# Patient Record
Sex: Male | Born: 1985
Health system: Southern US, Community
[De-identification: ages and names within clinical notes are randomized; demographics above are authoritative.]

---

## 2014-03-23 ENCOUNTER — Emergency Department (HOSPITAL_COMMUNITY): Payer: Self-pay

## 2014-03-23 ENCOUNTER — Encounter (HOSPITAL_COMMUNITY): Payer: Self-pay | Admitting: Emergency Medicine

## 2014-03-23 ENCOUNTER — Emergency Department (HOSPITAL_COMMUNITY)
Admission: EM | Admit: 2014-03-23 | Discharge: 2014-03-23 | Disposition: A | Discharge status: Home / Self Care | Payer: Self-pay | Attending: Emergency Medicine | Admitting: Emergency Medicine

## 2014-03-23 DIAGNOSIS — M20022 Boutonniere deformity of left finger(s): Secondary | ICD-10-CM

## 2014-03-23 DIAGNOSIS — Y9361 Activity, american tackle football: Secondary | ICD-10-CM | POA: Insufficient documentation

## 2014-03-23 DIAGNOSIS — Y9239 Other specified sports and athletic area as the place of occurrence of the external cause: Secondary | ICD-10-CM | POA: Insufficient documentation

## 2014-03-23 DIAGNOSIS — F17200 Nicotine dependence, unspecified, uncomplicated: Secondary | ICD-10-CM | POA: Insufficient documentation

## 2014-03-23 DIAGNOSIS — M20029 Boutonniere deformity of unspecified finger(s): Secondary | ICD-10-CM | POA: Insufficient documentation

## 2014-03-23 DIAGNOSIS — Y92838 Other recreation area as the place of occurrence of the external cause: Secondary | ICD-10-CM

## 2014-03-23 DIAGNOSIS — X500XXA Overexertion from strenuous movement or load, initial encounter: Secondary | ICD-10-CM | POA: Insufficient documentation

## 2014-03-23 NOTE — Discharge Instructions (Signed)
Read the information below.  You may return to the Emergency Department at any time for worsening condition or any new symptoms that concern you.  If you develop uncontrolled pain, weakness or numbness of the extremity, severe discoloration of the skin, or you are unable to move your finger, return to the ER for a recheck.    °

## 2014-03-23 NOTE — ED Provider Notes (Signed)
CSN: [id number]     Arrival date & time 03/23/14  1649 History   First MD Initiated Contact with Patient 03/23/14 1727     Chief Complaint  Patient presents with  . Finger Injury     (Consider location/radiation/quality/duration/timing/severity/associated sxs/prior Treatment) The history is provided by the patient.    Pt reports he was blocking another player while playing football last month and his left 4th and 5th fingers bent backwards.  States the trainer taped his fingers for 1-2 weeks and they improved somewhat, but he has continued to have mild swelling and pain in the fingers, around the PIP joints.  He is unable to fully straighten the 5th finger at the PIP joint.  Has pain over the radial aspect of the 4th digit PIP.  Pain is only occasional, especially when his hand gets caught on something.  Denies weakness or numbness of the fingers.  Denies new injury.  Denies other injury.    History reviewed. No pertinent past medical history. History reviewed. No pertinent past surgical history. No family history on file. History  Substance Use Topics  . Smoking status: Current Every Day Smoker    Types: Cigars  . Smokeless tobacco: Never Used  . Alcohol Use: Yes     Comment: Socially     Review of Systems  Constitutional: Negative for fever.  Musculoskeletal: Positive for arthralgias and joint swelling.  Skin: Negative for color change, rash and wound.  Allergic/Immunologic: Negative for immunocompromised state.  Neurological: Negative for weakness and numbness.  Hematological: Does not bruise/bleed easily.      Allergies  Review of patient's allergies indicates no known allergies.  Home Medications   Prior to Admission medications   Not on File   BP 136/84  Pulse 67  Temp(Src) 98.4 F (36.9 C) (Oral)  Resp 20  SpO2 100% Physical Exam  Nursing note and vitals reviewed. Constitutional: He appears well-developed and well-nourished. No distress.  HENT:  Head:  Normocephalic and atraumatic.  Neck: Neck supple.  Pulmonary/Chest: Effort normal.  Musculoskeletal:       Left wrist: Normal.       Hands: Pt able to fully flex all fingers, strength 5/5 with flexion and extension.  Unable to fully extend 5th digit.  4th and 5th digits on left hand slightly larger than same digits of right hand.  Capillary refill < 2 seconds, sensation intact.    Neurological: He is alert.  Skin: He is not diaphoretic.    ED Course  Procedures (including critical care time) Labs Review Labs Reviewed - No data to display  Imaging Review Dg Hand Complete Left  03/23/2014   CLINICAL DATA:  Persistent pain and inability to straighten the little finger since trauma to the fourth and fifth digits 1 month ago.  EXAM: LEFT HAND - COMPLETE 3+ VIEW  COMPARISON:  None.  FINDINGS: There is a boutonniere deformity of the little finger. There is slight irregularity of the volar aspect of the base of the distal phalanx but there is no osseous avulsion.  There is a small ossification at the volar aspect of the PIP joint of the ring finger which could represent a tiny old avulsion but there is no visible defect at the base of the middle phalanx.  IMPRESSION: No acute osseous abnormalities. Boutonniere deformity of the little finger.   Electronically Signed   By: Geanie CooleyJim  Maxwell M.D.   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 Neurovascularly intact.  Xray shows boutonniere deformity.  Placed in finger splint, hand surgery follow up. Pt given information regarding his diagnosis. Discussed result, findings, treatment, and follow up  with patient.  Pt given return precautions.  Pt verbalizes understanding and agrees with plan.         Trixie Dredgemily Katielynn Horan, PA-C 03/23/14 1934

## 2014-03-23 NOTE — ED Notes (Addendum)
Pt reports jamming his fingers (left pinky and ring finger) one month ago while playing football. Pt reports that he has had this happen in the past, however the swelling has lasted longer than it normally does. Pt is A/O x4, in NAD, and vitals are WDL. Capillary refill in the affected fingers is less than 2 seconds and pt is able to touch the palm of his hand with all fingers.

## 2014-03-30 NOTE — ED Provider Notes (Signed)
Medical screening examination/treatment/procedure(s) were performed by non-physician practitioner and as supervising physician I was immediately available for consultation/collaboration.    Jonny Longino L Taleigh Gero, MD 03/30/14 0725 

## 2015-12-10 DIAGNOSIS — L219 Seborrheic dermatitis, unspecified: Secondary | ICD-10-CM | POA: Diagnosis not present

## 2016-02-19 DIAGNOSIS — L03317 Cellulitis of buttock: Secondary | ICD-10-CM | POA: Diagnosis not present

## 2016-02-19 DIAGNOSIS — L723 Sebaceous cyst: Secondary | ICD-10-CM | POA: Diagnosis not present

## 2016-02-27 DIAGNOSIS — L701 Acne conglobata: Secondary | ICD-10-CM | POA: Diagnosis not present

## 2016-12-04 DIAGNOSIS — M9904 Segmental and somatic dysfunction of sacral region: Secondary | ICD-10-CM | POA: Diagnosis not present

## 2016-12-04 DIAGNOSIS — M9902 Segmental and somatic dysfunction of thoracic region: Secondary | ICD-10-CM | POA: Diagnosis not present

## 2016-12-04 DIAGNOSIS — M62830 Muscle spasm of back: Secondary | ICD-10-CM | POA: Diagnosis not present

## 2016-12-04 DIAGNOSIS — S336XXA Sprain of sacroiliac joint, initial encounter: Secondary | ICD-10-CM | POA: Diagnosis not present

## 2016-12-04 DIAGNOSIS — M5386 Other specified dorsopathies, lumbar region: Secondary | ICD-10-CM | POA: Diagnosis not present

## 2016-12-04 DIAGNOSIS — M9903 Segmental and somatic dysfunction of lumbar region: Secondary | ICD-10-CM | POA: Diagnosis not present

## 2017-03-02 MED FILL — HYDROCODON-APAP 5-325: 5-325 | 4 days supply | Qty: 20 | Fill #0

## 2017-03-02 MED FILL — PENICILLIN VK 500 MG TABLET: 500 | 7 days supply | Qty: 28 | Fill #0

## 2017-07-20 ENCOUNTER — Emergency Department (HOSPITAL_COMMUNITY)
Admission: EM | Admit: 2017-07-20 | Discharge: 2017-07-20 | Disposition: A | Discharge status: Home / Self Care | Payer: 59 | Attending: Emergency Medicine | Admitting: Emergency Medicine

## 2017-07-20 ENCOUNTER — Emergency Department (HOSPITAL_COMMUNITY): Payer: 59

## 2017-07-20 ENCOUNTER — Encounter (HOSPITAL_COMMUNITY): Payer: Self-pay | Admitting: Emergency Medicine

## 2017-07-20 DIAGNOSIS — Z87891 Personal history of nicotine dependence: Secondary | ICD-10-CM | POA: Insufficient documentation

## 2017-07-20 DIAGNOSIS — R319 Hematuria, unspecified: Secondary | ICD-10-CM | POA: Diagnosis not present

## 2017-07-20 DIAGNOSIS — R310 Gross hematuria: Secondary | ICD-10-CM | POA: Insufficient documentation

## 2017-07-20 LAB — URINALYSIS, ROUTINE W REFLEX MICROSCOPIC
BILIRUBIN URINE: NEGATIVE
Glucose, UA: NEGATIVE mg/dL
KETONES UR: NEGATIVE mg/dL
LEUKOCYTES UA: NEGATIVE
NITRITE: NEGATIVE
PH: 6 (ref 5.0–8.0)
PROTEIN: NEGATIVE mg/dL
Specific Gravity, Urine: 1.016 (ref 1.005–1.030)
Squamous Epithelial / LPF: NONE SEEN

## 2017-07-20 LAB — CBC
HCT: 42.1 % (ref 39.0–52.0)
Hemoglobin: 14.1 g/dL (ref 13.0–17.0)
MCH: 30.3 pg (ref 26.0–34.0)
MCHC: 33.5 g/dL (ref 30.0–36.0)
MCV: 90.3 fL (ref 78.0–100.0)
PLATELETS: 204 10*3/uL (ref 150–400)
RBC: 4.66 MIL/uL (ref 4.22–5.81)
RDW: 12.8 % (ref 11.5–15.5)
WBC: 5.1 10*3/uL (ref 4.0–10.5)

## 2017-07-20 LAB — CK: CK TOTAL: 359 U/L (ref 49–397)

## 2017-07-20 LAB — BASIC METABOLIC PANEL
Anion gap: 6 (ref 5–15)
BUN: 15 mg/dL (ref 6–20)
CALCIUM: 9.5 mg/dL (ref 8.9–10.3)
CO2: 26 mmol/L (ref 22–32)
CREATININE: 1.26 mg/dL — AB (ref 0.61–1.24)
Chloride: 105 mmol/L (ref 101–111)
Glucose, Bld: 87 mg/dL (ref 65–99)
Potassium: 4.5 mmol/L (ref 3.5–5.1)
SODIUM: 137 mmol/L (ref 135–145)

## 2017-07-20 NOTE — ED Notes (Signed)
Pt to CT

## 2017-07-20 NOTE — Discharge Instructions (Signed)
Drink plenty of fluids and get plenty of rest.  Return to the emergency department if you develop high fever, severe pain, or other new and concerning symptoms.

## 2017-07-20 NOTE — ED Provider Notes (Signed)
[facility]EMER[facility]ENCY DEPT Provider Note   CSN: [id number] Arrival date & time: 07/20/17  1634     History   Chief Complaint Chief Complaint  Patient presents with  . Hematuria    HPI Wesley Aguilar is a 31 y.o. male.  Patient is a 31 year old male with no significant past medical history presenting with complaints of hematuria. He states that this morning he woke up and went to the bathroom. When he urinated, he noticed that it was dark in color. His urine stream then stopped, then began again when he noticed a small clot. He denies any fevers or chills. He denies any dysuria. His significant other was concerned about rhabdomyolysis and told him to come here to be evaluated. He denies any flank pain.   The history is provided by the patient.  Hematuria  This is a new problem. Episode onset: this morning. The problem occurs constantly. The problem has not changed since onset.Pertinent negatives include no abdominal pain. Nothing aggravates the symptoms. Nothing relieves the symptoms. He has tried nothing for the symptoms.    History reviewed. No pertinent past medical history.  There are no active problems to display for this patient.   History reviewed. No pertinent surgical history.     Home Medications    Prior to Admission medications   Not on File    Family History No family history on file.  Social History Social History  Substance Use Topics  . Smoking status: Former Games developermoker  . Smokeless tobacco: Never Used  . Alcohol use Yes     Allergies   Patient has no known allergies.   Review of Systems Review of Systems  Gastrointestinal: Negative for abdominal pain.  Genitourinary: Positive for hematuria.  All other systems reviewed and are negative.    Physical Exam Updated Vital Signs BP 121/72 (BP Location: Right Arm)   Pulse 64   Temp 98 F (36.7 C) (Oral)   Resp 20   Ht 5\' 11"  (1.803 m)   Wt 107 kg (236 lb)   SpO2 99%   BMI 32.92 kg/m    Physical Exam  Constitutional: He is oriented to person, place, and time. He appears well-developed and well-nourished. No distress.  HENT:  Head: Normocephalic and atraumatic.  Mouth/Throat: Oropharynx is clear and moist.  Neck: Normal range of motion. Neck supple.  Cardiovascular: Normal rate and regular rhythm.  Exam reveals no friction rub.   No murmur heard. Pulmonary/Chest: Effort normal and breath sounds normal. No respiratory distress. He has no wheezes. He has no rales.  Abdominal: Soft. Bowel sounds are normal. He exhibits no distension. There is no tenderness.  Musculoskeletal: Normal range of motion. He exhibits no edema.  Neurological: He is alert and oriented to person, place, and time. Coordination normal.  Skin: Skin is warm and dry. He is not diaphoretic.  Nursing note and vitals reviewed.    ED Treatments / Results  Labs (all labs ordered are listed, but only abnormal results are displayed) Labs Reviewed  URINALYSIS, ROUTINE W REFLEX MICROSCOPIC  BASIC METABOLIC PANEL  CBC  CK    EKG  EKG Interpretation None       Radiology No results found.  Procedures Procedures (including critical care time)  Medications Ordered in ED Medications - No data to display   Initial Impression / Assessment and Plan / ED Course  I have reviewed the triage vital signs and the nursing notes.  Pertinent labs & imaging results that were available during my  care of the patient were reviewed by me and considered in my medical decision making (see chart for details).  Patient presents with complaints of passing a small clot and gross hematuria that is painless. This started this morning and has since resolved. His workup today reveals microscopic hematuria with normal renal CT. He is also quite active and works out with [employer] on a daily basis. There is no evidence of rhabdomyolysis. He will be discharged with increased fluids and follow-up as needed if symptoms  recur.  Final Clinical Impressions(s) / ED Diagnoses   Final diagnoses:  None    New Prescriptions New Prescriptions   No medications on file     Geoffery Lyons, MD 07/20/17 2056

## 2017-07-20 NOTE — ED Notes (Signed)
Patient Alert and oriented X4. Stable and ambulatory. Patient verbalized understanding of the discharge instructions.  Patient belongings were taken by the patient.  

## 2017-07-20 NOTE — ED Triage Notes (Signed)
Pt reports blood x1 episode with clots this am, denies dysuria or and penile discharge. Pt denies n/v, pain.

## 2017-07-21 ENCOUNTER — Encounter (HOSPITAL_COMMUNITY): Payer: Self-pay | Admitting: Emergency Medicine

## 2018-02-09 ENCOUNTER — Ambulatory Visit (INDEPENDENT_AMBULATORY_CARE_PROVIDER_SITE_OTHER): Payer: Self-pay | Admitting: Emergency Medicine

## 2018-02-09 VITALS — BP 124/90 | HR 66 | Temp 98.3°F | Resp 18

## 2018-02-09 DIAGNOSIS — J069 Acute upper respiratory infection, unspecified: Secondary | ICD-10-CM

## 2018-02-09 DIAGNOSIS — B9789 Other viral agents as the cause of diseases classified elsewhere: Secondary | ICD-10-CM

## 2018-02-09 DIAGNOSIS — J029 Acute pharyngitis, unspecified: Secondary | ICD-10-CM

## 2018-02-09 DIAGNOSIS — B309 Viral conjunctivitis, unspecified: Secondary | ICD-10-CM

## 2018-02-09 LAB — POCT RAPID STREP A (OFFICE): RAPID STREP A SCREEN: NEGATIVE

## 2018-02-09 MED ORDER — OLOPATADINE HCL 0.2 % OP SOLN: 1.0000 [drp] | Freq: Every day | OPHTHALMIC | 0 refills | Status: DC

## 2018-02-09 MED ORDER — LORATADINE-PSEUDOEPHEDRINE ER 10-240 MG PO TB24: 1.0000 | ORAL_TABLET | Freq: Every day | ORAL | 0 refills | Status: DC

## 2018-02-09 MED ORDER — BENZONATATE 100 MG PO CAPS: 100.0000 mg | ORAL_CAPSULE | Freq: Three times a day (TID) | ORAL | 0 refills | Status: DC | PRN

## 2018-02-09 NOTE — Patient Instructions (Signed)

## 2018-02-09 NOTE — Progress Notes (Signed)
Subjective:     Wesley Aguilar is a 32 y.o. male who presents for evaluation of symptoms of a URI. Symptoms include congestion, coryza, nasal congestion, non productive cough and sore throat. Onset of symptoms was 6 days ago, and has been unchanged since that time. Treatment to date: tylenol.  The following portions of the patient's history were reviewed and updated as appropriate: allergies and current medications.  Review of Systems Pertinent items noted in HPI and remainder of comprehensive ROS otherwise negative.   Objective:    BP 124/90 (BP Location: Right Arm, Patient Position: Sitting, Cuff Size: Normal)   Pulse 66   Temp 98.3 F (36.8 C) (Oral)   Resp 18   SpO2 97%  General appearance: alert, cooperative and appears stated age Head: Normocephalic, without obvious abnormality, atraumatic Eyes: positive findings: conjunctiva: injected Ears: normal TM's and external ear canals both ears Nose: Nares normal. Septum midline. Mucosa normal. No drainage or sinus tenderness. Throat: lips, mucosa, and tongue normal; teeth and gums normal Neck: moderate anterior cervical adenopathy Lungs: clear to auscultation bilaterally Heart: regular rate and rhythm Extremities: extremities normal, atraumatic, no cyanosis or edema Pulses: 2+ and symmetric   Assessment:    viral upper respiratory illness and viral conjunctivitis   Plan:    Discussed diagnosis and treatment of URI. Discussed the importance of avoiding unnecessary antibiotic therapy. Suggested symptomatic OTC remedies. Nasal saline spray for congestion. Follow up as needed. Follow up in 5 days or as needed. Pataday for conjucntivitis, doubt bacterial origin

## 2018-02-12 ENCOUNTER — Telehealth: Payer: Self-pay

## 2018-02-12 NOTE — Telephone Encounter (Signed)
Called to f/u with pt to see how he was feeling since his visit and he states he is doing better.

## 2018-03-29 ENCOUNTER — Ambulatory Visit (INDEPENDENT_AMBULATORY_CARE_PROVIDER_SITE_OTHER): Payer: 59 | Admitting: Family Medicine

## 2018-03-29 ENCOUNTER — Encounter: Payer: Self-pay | Admitting: Family Medicine

## 2018-03-29 VITALS — BP 126/80 | HR 68 | Temp 98.0°F | Ht 71.0 in | Wt 235.0 lb

## 2018-03-29 DIAGNOSIS — Z23 Encounter for immunization: Secondary | ICD-10-CM

## 2018-03-29 DIAGNOSIS — Z131 Encounter for screening for diabetes mellitus: Secondary | ICD-10-CM

## 2018-03-29 DIAGNOSIS — Z0000 Encounter for general adult medical examination without abnormal findings: Secondary | ICD-10-CM | POA: Diagnosis not present

## 2018-03-29 LAB — POCT URINALYSIS DIP (MANUAL ENTRY)
BILIRUBIN UA: NEGATIVE
Glucose, UA: NEGATIVE mg/dL
Ketones, POC UA: NEGATIVE mg/dL
LEUKOCYTES UA: NEGATIVE
Nitrite, UA: NEGATIVE
Protein Ur, POC: NEGATIVE mg/dL
RBC UA: NEGATIVE
Spec Grav, UA: 1.015 (ref 1.010–1.025)
Urobilinogen, UA: 0.2 E.U./dL
pH, UA: 5.5 (ref 5.0–8.0)

## 2018-03-29 LAB — POCT GLYCOSYLATED HEMOGLOBIN (HGB A1C): Hemoglobin A1C: 5.1

## 2018-03-29 NOTE — Patient Instructions (Addendum)
Recommend a lowfat, low carbohydrate diet divided over 5-6 small meals, increase water intake to 6-8 glasses, and 150 minutes per week of cardiovascular exercise.      Recommend monthly self testicular exams Recommend low-fat, low carbohydrate diet divided over 5-6 small meals throughout the day.  Also, increase water intake to 6-8 glasses/day. Recommend 150 minutes of low impact cardiovascular exercise per week for heart health. Recommend a One A Day for men.

## 2018-03-29 NOTE — Progress Notes (Signed)
Subjective:    Patient ID: Wesley Aguilar, male    DOB: 1986/05/01, 32 y.o.   MRN: [id number]  HPI Wesley Aguilar, a very pleasant 32 year old male presents to establish care and annual physical exam.  Patient presents for an annual physical examination. He states that he has been following a strict diet and exercise routine. He typically exercises 4-5 times per week. He is up to date with dental exams and eye exam. He does not perform monthly self-testicular exams. He is sexually active with is wife. Patient has a 7510 month old son. Last complete physical was greater than 1 year ago. He is not up to date with vaccinations.   History reviewed. No pertinent past medical history.  Social History   Socioeconomic History  . Marital status: Married    Spouse name: Not on file  . Number of children: Not on file  . Years of education: Not on file  . Highest education level: Not on file  Occupational History  . Not on file  Social Needs  . Financial resource strain: Not on file  . Food insecurity:    Worry: Not on file    Inability: Not on file  . Transportation needs:    Medical: Not on file    Non-medical: Not on file  Tobacco Use  . Smoking status: Former Games developermoker  . Smokeless tobacco: Never Used  Substance and Sexual Activity  . Alcohol use: Yes    Comment: Socially   . Drug use: No  . Sexual activity: Not on file  Lifestyle  . Physical activity:    Days per week: Not on file    Minutes per session: Not on file  . Stress: Not on file  Relationships  . Social connections:    Talks on phone: Not on file    Gets together: Not on file    Attends religious service: Not on file    Active member of club or organization: Not on file    Attends meetings of clubs or organizations: Not on file    Relationship status: Not on file  . Intimate partner violence:    Fear of current or ex partner: Not on file    Emotionally abused: Not on file    Physically abused: Not on file     Forced sexual activity: Not on file  Other Topics Concern  . Not on file  Social History Narrative   ** Merged History Encounter **       Review of Systems  Constitutional: Negative.   HENT: Negative.   Eyes: Negative.   Respiratory: Negative.   Cardiovascular: Negative.   Gastrointestinal: Negative.   Endocrine: Negative for polydipsia, polyphagia and polyuria.  Genitourinary: Negative.   Musculoskeletal: Negative.   Allergic/Immunologic: Negative.   Neurological: Negative.   Hematological: Negative.   Psychiatric/Behavioral: Negative.        Objective:   Physical Exam    BP 126/80 (BP Location: Right Arm, Patient Position: Sitting, Cuff Size: Large)   Pulse 68   Temp 98 F (36.7 C) (Oral)   Ht 5\' 11"  (1.803 m)   Wt 235 lb (106.6 kg)   SpO2 98%   BMI 32.78 kg/m   General Appearance:    Alert, cooperative, no distress, appears stated age  Head:    Normocephalic, without obvious abnormality, atraumatic  Eyes:    PERRL, conjunctiva/corneas clear, EOM's intact, fundi    benign, both eyes  Ears:    Normal TM's  and external ear canals, both ears  Nose:   Nares normal, septum midline, mucosa normal, no drainage    or sinus tenderness  Throat:   Lips, mucosa, and tongue normal; teeth and gums normal  Neck:   Supple, symmetrical, trachea midline, no adenopathy;    thyroid:  no enlargement/tenderness/nodules; no carotid   bruit or JVD  Back:     Symmetric, no curvature, ROM normal, no CVA tenderness  Lungs:     Clear to auscultation bilaterally, respirations unlabored  Chest Wall:    No tenderness or deformity   Heart:    Regular rate and rhythm, S1 and S2 normal, no murmur, rub   or gallop  Abdomen:     Soft, non-tender, bowel sounds active all four quadrants,    no masses, no organomegaly  Extremities:   Extremities normal, atraumatic, no cyanosis or edema  Pulses:   2+ and symmetric all extremities  Skin:   Skin color, texture, turgor normal, no rashes or lesions   Lymph nodes:   Cervical, supraclavicular, and axillary nodes normal  Neurologic:   CNII-XII intact, normal strength, sensation and reflexes    throughout       BP 126/80 (BP Location: Right Arm, Patient Position: Sitting, Cuff Size: Large)   Pulse 68   Temp 98 F (36.7 C) (Oral)   Ht 5\' 11"  (1.803 m)   Wt 235 lb (106.6 kg)   SpO2 98%   BMI 32.78 kg/m   Assessment & Plan:  1. Annual physical exam Recommend monthly self testicular exam Yearly eye exam Dental visits twice yearly Continue exercise regimen 4-5 days per week Balanced diet divided over 5-6 small meals throughout the day - Vitamin D, 25-hydroxy - CBC with Differential - Comprehensive metabolic panel - Lipid Panel - TSH  2. Screening for diabetes mellitus - POCT glycosylated hemoglobin (Hb A1C)  3. Need for Tdap vaccination - Tdap vaccine greater than or equal to 7yo IM   RTC: 1 year for annual physical or as needed   Nolon Nations  MSN, FNP-C Patient Care [facility] [address] [place], [place] [address] [phone]

## 2018-03-29 NOTE — Progress Notes (Signed)
poc

## 2018-03-30 ENCOUNTER — Other Ambulatory Visit: Payer: Self-pay | Admitting: Family Medicine

## 2018-03-30 ENCOUNTER — Telehealth: Payer: Self-pay

## 2018-03-30 DIAGNOSIS — E785 Hyperlipidemia, unspecified: Secondary | ICD-10-CM | POA: Insufficient documentation

## 2018-03-30 DIAGNOSIS — E559 Vitamin D deficiency, unspecified: Secondary | ICD-10-CM | POA: Insufficient documentation

## 2018-03-30 LAB — COMPREHENSIVE METABOLIC PANEL
A/G RATIO: 1.5 (ref 1.2–2.2)
ALT: 24 IU/L (ref 0–44)
AST: 24 IU/L (ref 0–40)
Albumin: 4.5 g/dL (ref 3.5–5.5)
Alkaline Phosphatase: 72 IU/L (ref 39–117)
BUN/Creatinine Ratio: 13 (ref 9–20)
BUN: 15 mg/dL (ref 6–20)
Bilirubin Total: 0.4 mg/dL (ref 0.0–1.2)
CHLORIDE: 99 mmol/L (ref 96–106)
CO2: 24 mmol/L (ref 20–29)
Calcium: 10.3 mg/dL — ABNORMAL HIGH (ref 8.7–10.2)
Creatinine, Ser: 1.2 mg/dL (ref 0.76–1.27)
GFR calc Af Amer: 92 mL/min/{1.73_m2} (ref >59)
GFR calc non Af Amer: 80 mL/min/{1.73_m2} (ref >59)
GLOBULIN, TOTAL: 3 g/dL (ref 1.5–4.5)
Glucose: 80 mg/dL (ref 65–99)
POTASSIUM: 4.6 mmol/L (ref 3.5–5.2)
SODIUM: 136 mmol/L (ref 134–144)
Total Protein: 7.5 g/dL (ref 6.0–8.5)

## 2018-03-30 LAB — CBC WITH DIFFERENTIAL/PLATELET
Basophils Absolute: 0 10*3/uL (ref 0.0–0.2)
Basos: 1 %
EOS (ABSOLUTE): 0.1 10*3/uL (ref 0.0–0.4)
Eos: 1 %
HEMATOCRIT: 43.8 % (ref 37.5–51.0)
Hemoglobin: 14.4 g/dL (ref 13.0–17.7)
Immature Grans (Abs): 0 10*3/uL (ref 0.0–0.1)
Immature Granulocytes: 0 %
Lymphocytes Absolute: 2.1 10*3/uL (ref 0.7–3.1)
Lymphs: 37 %
MCH: 30.1 pg (ref 26.6–33.0)
MCHC: 32.9 g/dL (ref 31.5–35.7)
MCV: 91 fL (ref 79–97)
Monocytes Absolute: 0.4 10*3/uL (ref 0.1–0.9)
Monocytes: 7 %
NEUTROS ABS: 3.1 10*3/uL (ref 1.4–7.0)
Neutrophils: 54 %
PLATELETS: 294 10*3/uL (ref 150–379)
RBC: 4.79 x10E6/uL (ref 4.14–5.80)
RDW: 13.7 % (ref 12.3–15.4)
WBC: 5.7 10*3/uL (ref 3.4–10.8)

## 2018-03-30 LAB — LIPID PANEL
Chol/HDL Ratio: 4.1 ratio (ref 0.0–5.0)
Cholesterol, Total: 204 mg/dL — ABNORMAL HIGH (ref 100–199)
HDL: 50 mg/dL (ref >39)
LDL Calculated: 139 mg/dL — ABNORMAL HIGH (ref 0–99)
Triglycerides: 77 mg/dL (ref 0–149)
VLDL Cholesterol Cal: 15 mg/dL (ref 5–40)

## 2018-03-30 LAB — VITAMIN D 25 HYDROXY (VIT D DEFICIENCY, FRACTURES): Vit D, 25-Hydroxy: 16.8 ng/mL — ABNORMAL LOW (ref 30.0–100.0)

## 2018-03-30 LAB — TSH: TSH: 1.14 u[IU]/mL (ref 0.450–4.500)

## 2018-03-30 MED ORDER — ERGOCALCIFEROL 1.25 MG (50000 UT) PO CAPS: 50000.0000 [IU] | ORAL_CAPSULE | ORAL | 1 refills | Status: DC

## 2018-03-30 NOTE — Telephone Encounter (Signed)
-----   Message from Massie MaroonLachina M Hollis, [place]NP sent at 03/30/2018  1:03 PM EDT ----- Regarding: lab results Please inform patient that vitamin D level is consistent with vitamin D deficiency. Will start [place] 50,000 weekly. Also, cholesterol is elevated. LDL is 139, goal is < 100. Recommend OTC Omega 3 fatty acid. Recommend a lowfat, low carbohydrate diet divided over 5-6 small meals, increase water intake to 6-8 glasses, and 150 minutes per week of cardiovascular exercise.   Schedule appt for labs in 12 weeks.   Nolon NationsLachina Moore Hollis  MSN, FNP-C Patient Care [facility] [address]  [place], [place] [id number] [phone]

## 2018-03-30 NOTE — Telephone Encounter (Signed)
Called, no answer. Left a message for patient to call back. Thanks!  

## 2018-03-30 NOTE — Progress Notes (Signed)
Meds ordered this encounter  Medications  . ergocalciferol (VITAMIN D2) 50000 units capsule    Sig: Take 1 capsule (50,000 Units total) by mouth once a week.    Dispense:  30 capsule    Refill:  1   Orders Placed This Encounter  Procedures  . Vitamin D, 25-hydroxy    Standing Status:   Future    Standing Expiration Date:   03/31/2019  . Lipid panel    Standing Status:   Future    Standing Expiration Date:   03/31/2019    Nolon NationsLachina Moore Marlie Kuennen  MSN, FNP-C Patient Care [facility] [address]  [place], [place] [address] [phone]

## 2018-03-31 NOTE — Telephone Encounter (Signed)
Called, no answer. Left a voicemail for patient to call back. Thanks!  

## 2018-12-12 IMAGING — CT CT RENAL STONE PROTOCOL
2 of 4 series · 17 of 46 positions shown, 19 images · non-contrast
Comparison: None.

CLINICAL DATA: Hematuria.

EXAM:
CT ABDOMEN AND PELVIS WITHOUT CONTRAST
TECHNIQUE: Multidetector CT imaging of the abdomen and pelvis was performed
following the standard protocol without IV contrast.

[Series 3: renal stone 5.0 · axial · 0.89mm/px · z∈[+830,+1290]mm · 14 of 100 slices shown, 16 images]
[im 4/100  soft-tissue]
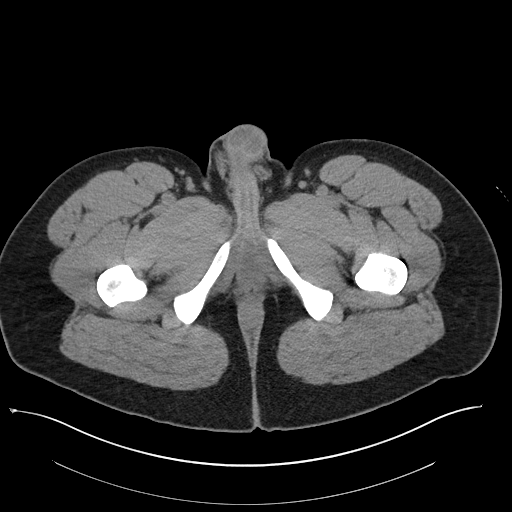
[im 4/100  bone]
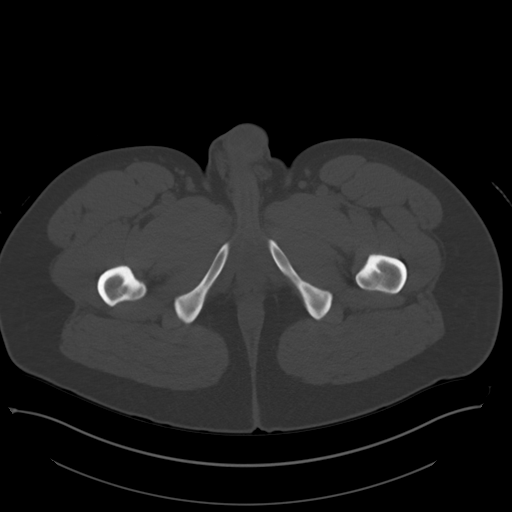
[im 12/100  soft-tissue]
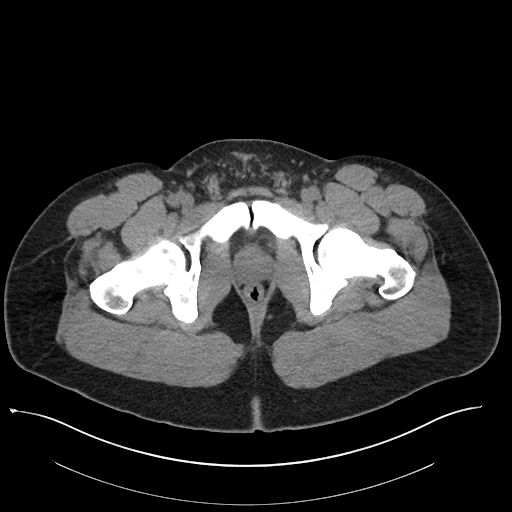
[im 20/100  soft-tissue]
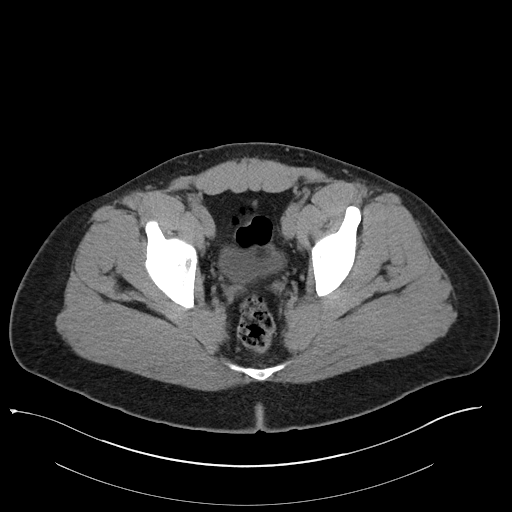
[im 28/100  soft-tissue]
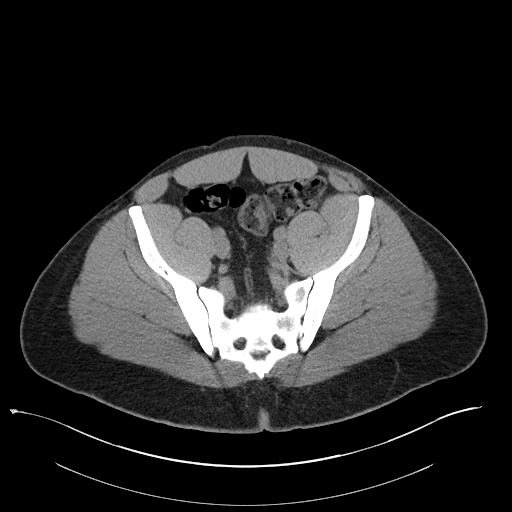
[im 32/100  soft-tissue]
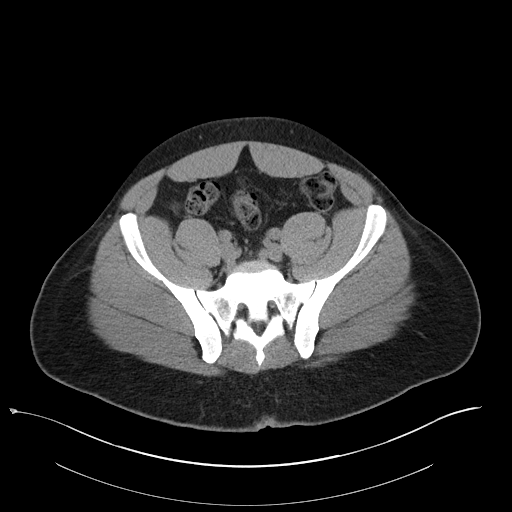
[im 40/100  soft-tissue]
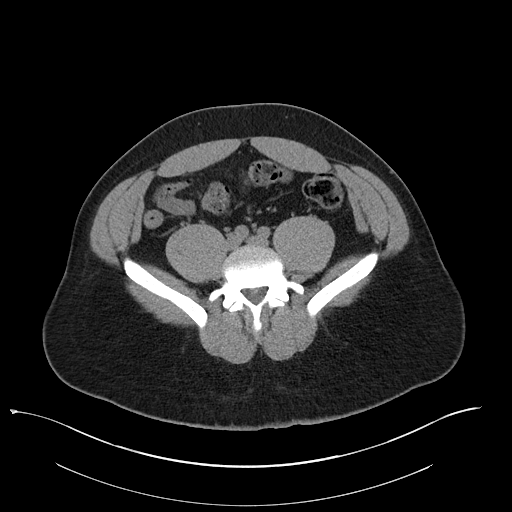
[im 48/100  soft-tissue]
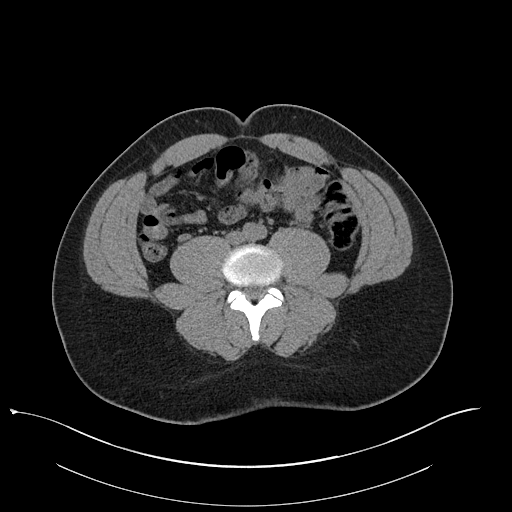
[im 52/100  soft-tissue]
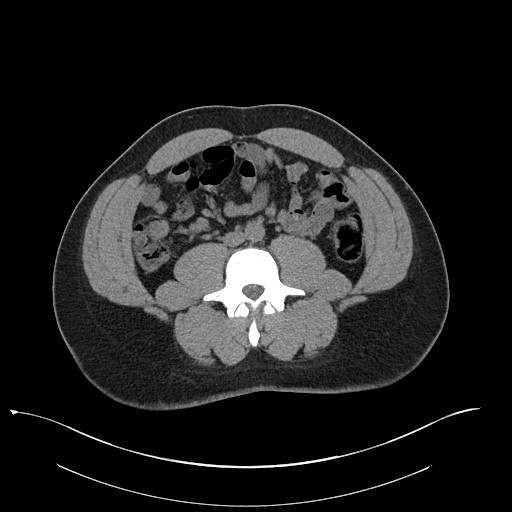
[im 60/100  soft-tissue]
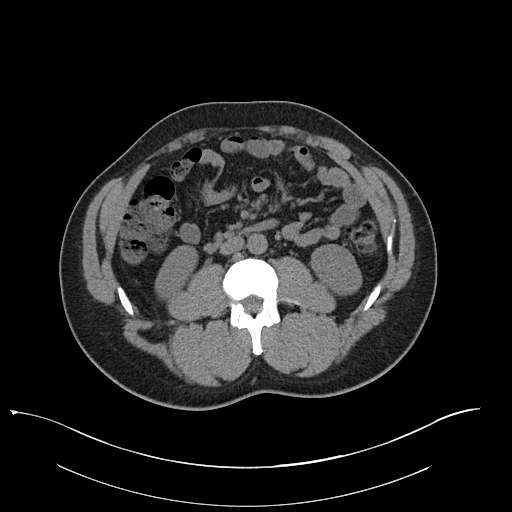
[im 60/100  bone]
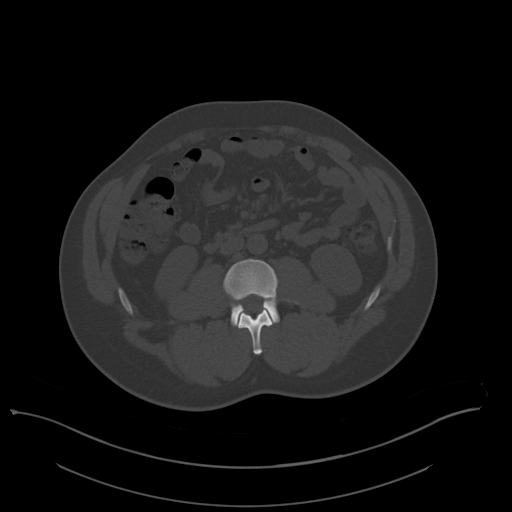
[im 68/100  soft-tissue]
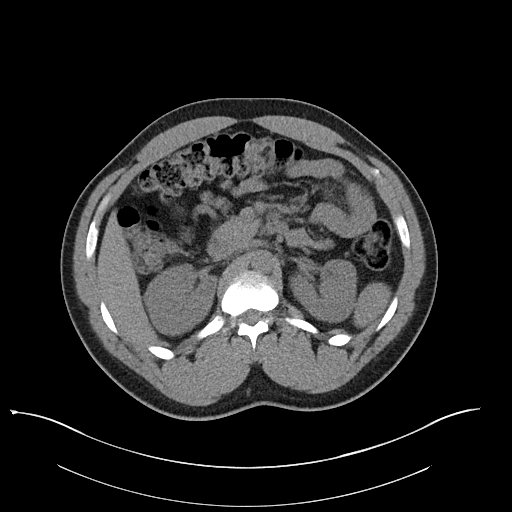
[im 76/100  soft-tissue]
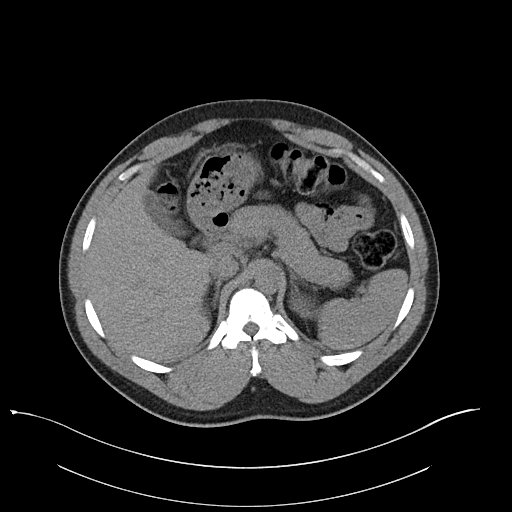
[im 80/100  soft-tissue]
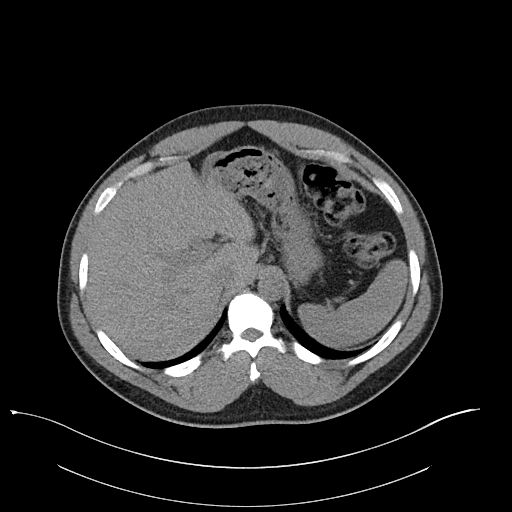
[im 88/100  soft-tissue]
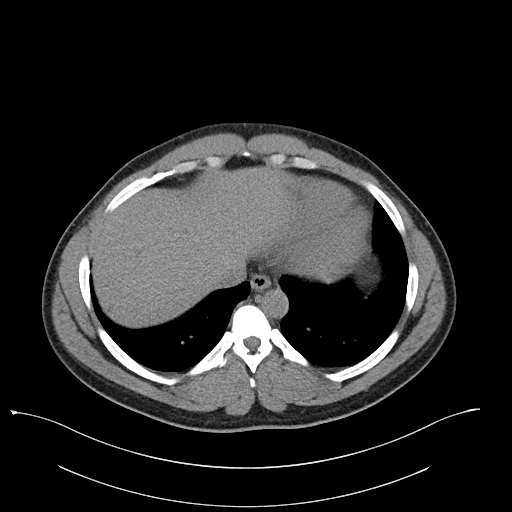
[im 96/100  soft-tissue]
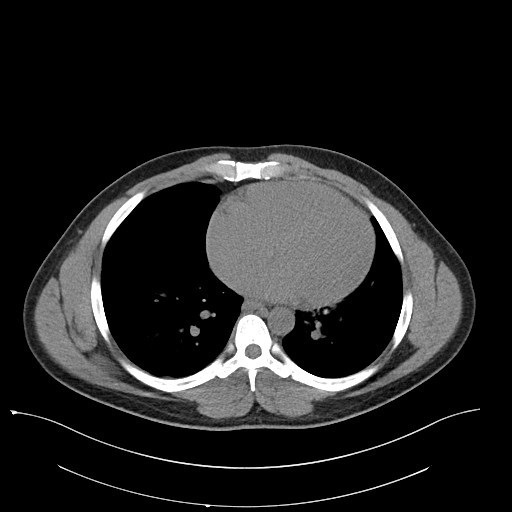

[Series 6: renal stone 3.0 cor · coronal · 0.86mm/px · 3 of 101 slices shown]
[im 34/101  soft-tissue]
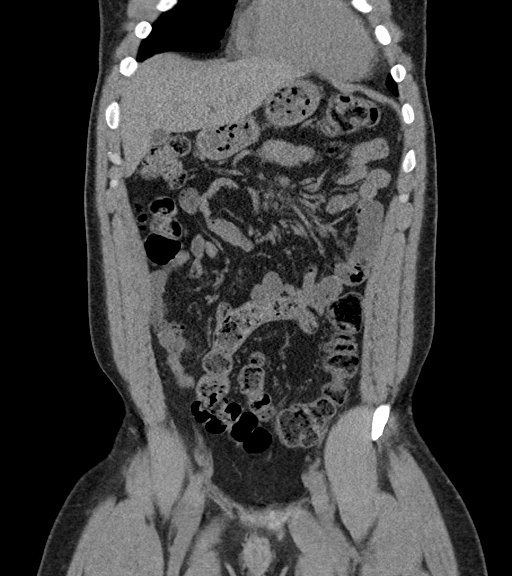
[im 45/101  soft-tissue]
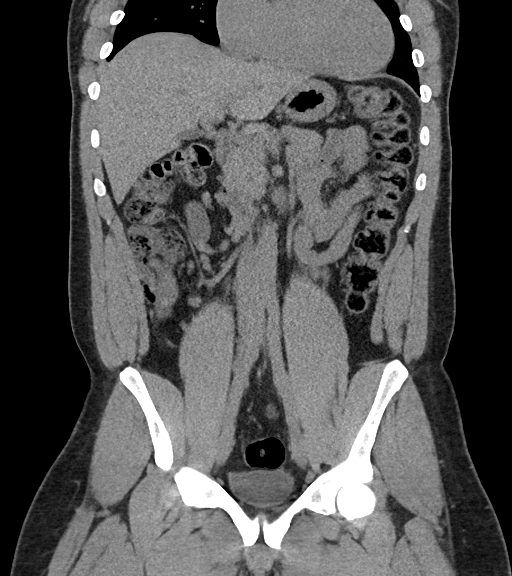
[im 56/101  soft-tissue]
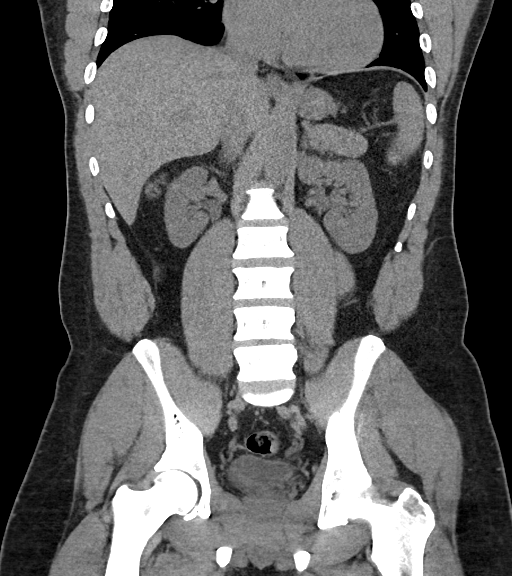

[17 of 46 positions shown; findings below may reference images not displayed]

FINDINGS: Lower chest: The lung bases are clear.

Hepatobiliary: No focal liver abnormality is seen. No gallstones,
gallbladder wall thickening, or biliary dilatation.

Pancreas: No ductal dilatation or inflammation.

Spleen: Normal in size without focal abnormality.

Adrenals/Urinary Tract: Adrenal glands are unremarkable. Kidneys are
normal, without renal calculi, focal lesion, or hydronephrosis.
Bladder is unremarkable.

Stomach/Bowel: Stomach is within normal limits. Appendix appears
normal. No evidence of bowel wall thickening, distention, or
inflammatory changes.

Vascular/Lymphatic: Normal caliber abdominal aorta. No abdominal or
pelvic adenopathy.

Reproductive: Prostate is unremarkable.

Other: No free air, free fluid, or intra-abdominal fluid collection.
Tiny fat containing umbilical hernia.

Musculoskeletal: There are no acute or suspicious osseous
abnormalities.
IMPRESSION: No renal stones or obstructive uropathy. No explanation for
hematuria on noncontrast exam.

## 2019-02-02 ENCOUNTER — Encounter: Payer: Self-pay | Admitting: Family Medicine

## 2019-02-02 ENCOUNTER — Ambulatory Visit (INDEPENDENT_AMBULATORY_CARE_PROVIDER_SITE_OTHER): Payer: 59 | Admitting: Family Medicine

## 2019-02-02 VITALS — BP 113/81 | HR 59 | Temp 97.8°F | Resp 16 | Ht 71.0 in | Wt 230.0 lb

## 2019-02-02 DIAGNOSIS — Z0000 Encounter for general adult medical examination without abnormal findings: Secondary | ICD-10-CM | POA: Diagnosis not present

## 2019-02-02 DIAGNOSIS — Z23 Encounter for immunization: Secondary | ICD-10-CM | POA: Diagnosis not present

## 2019-02-02 NOTE — Progress Notes (Signed)
  [facility]   Progress Note: General Provider: Mike Gip, FNP  SUBJECTIVE:   Wesley Aguilar is a 33 y.o. male who  has no past medical history on file.. Patient presents today for Annual Exam  Patient presents for annual exam. states that he sometimes has some blurred vision. Happened 5 times in the past year. States that he eats and it is resolved. No problems or concerns today.  Review of Systems  Constitutional: Negative.   HENT: Negative.   Eyes: Negative.   Respiratory: Negative.   Cardiovascular: Negative.   Gastrointestinal: Negative.   Genitourinary: Negative.   Musculoskeletal: Negative.   Skin: Negative.   Neurological: Negative.   Psychiatric/Behavioral: Negative.      OBJECTIVE: BP 113/81 (BP Location: Right Arm, Patient Position: Sitting, Cuff Size: Normal)   Pulse (!) 59   Temp 97.8 F (36.6 C) (Oral)   Resp 16   Ht 5\' 11"  (1.803 m)   Wt 230 lb (104.3 kg)   SpO2 99%   BMI 32.08 kg/m   Wt Readings from Last 3 Encounters:  02/02/19 230 lb (104.3 kg)  03/29/18 235 lb (106.6 kg)  07/20/17 236 lb (107 kg)     Physical Exam Vitals signs and nursing note reviewed.  Constitutional:      General: He is not in acute distress.    Appearance: He is well-developed.  HENT:     Head: Normocephalic and atraumatic.     Right Ear: Tympanic membrane normal.     Left Ear: Tympanic membrane normal.     Mouth/Throat:     Mouth: Mucous membranes are moist.  Eyes:     Extraocular Movements: Extraocular movements intact.     Conjunctiva/sclera: Conjunctivae normal.     Pupils: Pupils are equal, round, and reactive to light.  Neck:     Musculoskeletal: Normal range of motion.  Cardiovascular:     Rate and Rhythm: Normal rate and regular rhythm.     Heart sounds: Normal heart sounds.  Pulmonary:     Effort: Pulmonary effort is normal. No respiratory distress.     Breath sounds: Normal breath sounds.    Abdominal:     General: Bowel sounds are normal. There is no distension.     Palpations: Abdomen is soft.  Musculoskeletal: Normal range of motion.  Skin:    General: Skin is warm and dry.  Neurological:     Mental Status: He is alert and oriented to person, place, and time.  Psychiatric:        Mood and Affect: Mood normal.        Behavior: Behavior normal.        Thought Content: Thought content normal.     ASSESSMENT/PLAN:   1. Annual physical exam Discussed eating regular meals to help with low blood sugar.  - HIV antibody (with reflex) - CBC with Differential - Comprehensive metabolic panel - Hemoglobin A1c   Return in about 1 year (around 02/03/2020).    The patient was given clear instructions to go to ER or return to medical center if symptoms do not improve, worsen or new problems develop. The patient verbalized understanding and agreed with plan of care.   Ms. [name]. [name], FNP-BC Patient Care Center [facility] [address] [place], [place] [address] [phone]

## 2019-02-02 NOTE — Patient Instructions (Signed)

## 2019-02-03 LAB — CBC WITH DIFFERENTIAL/PLATELET
Basophils Absolute: 0 10*3/uL (ref 0.0–0.2)
Basos: 1 %
EOS (ABSOLUTE): 0.1 10*3/uL (ref 0.0–0.4)
Eos: 2 %
Hematocrit: 44.8 % (ref 37.5–51.0)
Hemoglobin: 14.9 g/dL (ref 13.0–17.7)
Immature Grans (Abs): 0 10*3/uL (ref 0.0–0.1)
Immature Granulocytes: 0 %
Lymphocytes Absolute: 1.8 10*3/uL (ref 0.7–3.1)
Lymphs: 42 %
MCH: 29.7 pg (ref 26.6–33.0)
MCHC: 33.3 g/dL (ref 31.5–35.7)
MCV: 89 fL (ref 79–97)
Monocytes Absolute: 0.5 10*3/uL (ref 0.1–0.9)
Monocytes: 11 %
Neutrophils Absolute: 1.9 10*3/uL (ref 1.4–7.0)
Neutrophils: 44 %
Platelets: 265 10*3/uL (ref 150–450)
RBC: 5.01 x10E6/uL (ref 4.14–5.80)
RDW: 12.5 % (ref 11.6–15.4)
WBC: 4.4 10*3/uL (ref 3.4–10.8)

## 2019-02-03 LAB — COMPREHENSIVE METABOLIC PANEL
ALT: 40 IU/L (ref 0–44)
AST: 44 IU/L — ABNORMAL HIGH (ref 0–40)
Albumin/Globulin Ratio: 1.8 (ref 1.2–2.2)
Albumin: 4.3 g/dL (ref 4.0–5.0)
Alkaline Phosphatase: 58 IU/L (ref 39–117)
BUN/Creatinine Ratio: 14 (ref 9–20)
BUN: 19 mg/dL (ref 6–20)
Bilirubin Total: 0.4 mg/dL (ref 0.0–1.2)
CO2: 23 mmol/L (ref 20–29)
Calcium: 9.8 mg/dL (ref 8.7–10.2)
Chloride: 102 mmol/L (ref 96–106)
Creatinine, Ser: 1.32 mg/dL — ABNORMAL HIGH (ref 0.76–1.27)
GFR calc Af Amer: 82 mL/min/{1.73_m2} (ref >59)
GFR calc non Af Amer: 71 mL/min/{1.73_m2} (ref >59)
Globulin, Total: 2.4 g/dL (ref 1.5–4.5)
Glucose: 77 mg/dL (ref 65–99)
Potassium: 4.4 mmol/L (ref 3.5–5.2)
Sodium: 138 mmol/L (ref 134–144)
Total Protein: 6.7 g/dL (ref 6.0–8.5)

## 2019-02-03 LAB — HEMOGLOBIN A1C
Est. average glucose Bld gHb Est-mCnc: 108 mg/dL
Hgb A1c MFr Bld: 5.4 % (ref 4.8–5.6)

## 2019-02-03 LAB — HIV ANTIBODY (ROUTINE TESTING W REFLEX): HIV Screen 4th Generation wRfx: NONREACTIVE

## 2019-02-04 ENCOUNTER — Telehealth: Payer: Self-pay

## 2019-02-04 NOTE — Telephone Encounter (Signed)
-----   Message from Mike Gip, FNP sent at 02/03/2019  4:59 PM EST ----- Please let patient know that all labs were normal.

## 2019-02-04 NOTE — Telephone Encounter (Signed)
Called, no answer. Left a message that all labs were normal and if any questions to call back to our office. Thanks!

## 2019-05-06 ENCOUNTER — Telehealth: Payer: Self-pay

## 2019-05-06 NOTE — Telephone Encounter (Signed)
Called to do COVID Screening for appointment tomorrow. No answer. Left a message to call back. Thanks! 

## 2019-05-09 ENCOUNTER — Ambulatory Visit (INDEPENDENT_AMBULATORY_CARE_PROVIDER_SITE_OTHER): Payer: 59 | Admitting: Family Medicine

## 2019-05-09 ENCOUNTER — Other Ambulatory Visit: Payer: Self-pay

## 2019-05-09 ENCOUNTER — Encounter: Payer: Self-pay | Admitting: Family Medicine

## 2019-05-09 VITALS — BP 133/86 | HR 66 | Temp 97.8°F | Resp 16 | Ht 71.0 in | Wt 235.0 lb

## 2019-05-09 DIAGNOSIS — R311 Benign essential microscopic hematuria: Secondary | ICD-10-CM

## 2019-05-09 LAB — POCT URINALYSIS DIPSTICK
Bilirubin, UA: NEGATIVE
Blood, UA: NEGATIVE
Glucose, UA: NEGATIVE
Ketones, UA: NEGATIVE
Leukocytes, UA: NEGATIVE
Nitrite, UA: NEGATIVE
Protein, UA: NEGATIVE
Spec Grav, UA: 1.02 (ref 1.010–1.025)
Urobilinogen, UA: 1 E.U./dL
pH, UA: 7.5 (ref 5.0–8.0)

## 2019-05-09 NOTE — Patient Instructions (Signed)
Hematuria, Adult  Hematuria is blood in the urine. Blood may be visible in the urine, or it may be identified with a test. This condition can be caused by infections of the bladder, urethra, kidney, or prostate. Other possible causes include:   Kidney stones.   Cancer of the urinary tract.   Too much calcium in the urine.   Conditions that are passed from parent to child (inherited conditions).   Exercise that requires a lot of energy.  Infections can usually be treated with medicine, and a kidney stone usually will pass through your urine. If neither of these is the cause of your hematuria, more tests may be needed to identify the cause of your symptoms.  It is very important to tell your health care provider about any blood in your urine, even if it is painless or the blood stops without treatment. Blood in the urine, when it happens and then stops and then happens again, can be a symptom of a very serious condition, including cancer. There is no pain in the initial stages of many urinary cancers.  Follow these instructions at home:  Medicines   Take over-the-counter and prescription medicines only as told by your health care provider.   If you were prescribed an antibiotic medicine, take it as told by your health care provider. Do not stop taking the antibiotic even if you start to feel better.  Eating and drinking   Drink enough fluid to keep your urine clear or pale yellow. It is recommended that you drink 3-4 quarts (2.8-3.8 L) a day. If you have been diagnosed with an infection, it is recommended that you drink cranberry juice in addition to large amounts of water.   Avoid caffeine, tea, and carbonated beverages. These tend to irritate the bladder.   Avoid alcohol because it may irritate the prostate (men).  General instructions   If you have been diagnosed with a kidney stone, follow your health care provider's instructions about straining your urine to catch the stone.   Empty your bladder  often. Avoid holding urine for long periods of time.   If you are male:  ? After a bowel movement, wipe from front to back and use each piece of toilet paper only once.  ? Empty your bladder before and after sex.   Pay attention to any changes in your symptoms. Tell your health care provider about any changes or any new symptoms.   It is your responsibility to get your test results. Ask your health care provider, or the department performing the test, when your results will be ready.   Keep all follow-up visits as told by your health care provider. This is important.  Contact a health care provider if:   You develop back pain.   You have a fever.   You have nausea or vomiting.   Your symptoms do not improve after 3 days.   Your symptoms get worse.  Get help right away if:   You develop severe vomiting and are unable take medicine without vomiting.   You develop severe pain in your back or abdomen even though you are taking medicine.   You pass a large amount of blood in your urine.   You pass blood clots in your urine.   You feel very weak or like you might faint.   You faint.  Summary   Hematuria is blood in the urine. It has many possible causes.   It is very important that you tell   your health care provider about any blood in your urine, even if it is painless or the blood stops without treatment.   Take over-the-counter and prescription medicines only as told by your health care provider.   Drink enough fluid to keep your urine clear or pale yellow.  This information is not intended to replace advice given to you by your health care provider. Make sure you discuss any questions you have with your health care provider.  Document Released: 11/24/2005 Document Revised: 12/27/2016 Document Reviewed: 12/27/2016  Elsevier Interactive Patient Education  2019 Elsevier Inc[employer]

## 2019-05-09 NOTE — Progress Notes (Signed)
  [facility]   Progress Note: Sick Visit Provider: Mike Gip, FNP  SUBJECTIVE:   Wesley Aguilar is a 33 y.o. male who  has no past medical history on file.. Patient presents today for Hematuria (6 times in the last year and 1/2. most of the time after sex  ) He was seen in the ED for this in 2018. Renal CT normal. No signs of rhabdomyalisis. He does work out and performs physical activity at his job. He denies abdominal pain, burning with urination, fever, chills or night sweats. No lower back pain. Married with 1 male sexual partner.  Review of Systems  Genitourinary: Positive for hematuria. Negative for dysuria and flank pain.  All other systems reviewed and are negative.    OBJECTIVE: BP 133/86 (BP Location: Left Arm, Patient Position: Sitting, Cuff Size: Normal)   Pulse 66   Temp 97.8 F (36.6 C) (Oral)   Resp 16   Ht 5\' 11"  (1.803 m)   Wt 235 lb (106.6 kg)   SpO2 100%   BMI 32.78 kg/m   Wt Readings from Last 3 Encounters:  05/09/19 235 lb (106.6 kg)  02/02/19 230 lb (104.3 kg)  03/29/18 235 lb (106.6 kg)     Physical Exam Vitals signs and nursing note reviewed.  Constitutional:      General: He is not in acute distress.    Appearance: Normal appearance.  HENT:     Head: Normocephalic and atraumatic.  Eyes:     Extraocular Movements: Extraocular movements intact.     Conjunctiva/sclera: Conjunctivae normal.     Pupils: Pupils are equal, round, and reactive to light.  Cardiovascular:     Rate and Rhythm: Normal rate.     Heart sounds: No murmur.  Pulmonary:     Effort: Pulmonary effort is normal.  Musculoskeletal: Normal range of motion.  Skin:    General: Skin is warm and dry.  Neurological:     Mental Status: He is alert and oriented to person, place, and time.  Psychiatric:        Mood and Affect: Mood normal.        Behavior: Behavior normal.        Thought Content: Thought content normal.      Judgment: Judgment normal.     ASSESSMENT/PLAN:   1. Benign essential microscopic hematuria Discussed possible causes for hematuria to include working out, trauma, s/p sexual intercourse, etc. Advised patient to increase fluids. Labs pending. Previous imaging was negative.  - Urinalysis Dipstick - [facility] - Comprehensive metabolic panel       The patient was given clear instructions to go to ER or return to medical center if symptoms do not improve, worsen or new problems develop. The patient verbalized understanding and agreed with plan of care.   Ms. [name]. [name], FNP-BC [facility] [address] [place], [place] [address] [phone]     This note has been created with Dragon speech recognition software and smart phrase technology. Any transcriptional errors are unintentional.

## 2019-05-10 LAB — CREATININE KINASE MB: CK-MB Index: 1.9 ng/mL (ref 0.0–10.4)

## 2019-05-10 LAB — COMPREHENSIVE METABOLIC PANEL
ALT: 23 IU/L (ref 0–44)
AST: 27 IU/L (ref 0–40)
Albumin/Globulin Ratio: 1.6 (ref 1.2–2.2)
Albumin: 4.2 g/dL (ref 4.0–5.0)
Alkaline Phosphatase: 54 IU/L (ref 39–117)
BUN/Creatinine Ratio: 16 (ref 9–20)
BUN: 18 mg/dL (ref 6–20)
Bilirubin Total: 0.4 mg/dL (ref 0.0–1.2)
CO2: 21 mmol/L (ref 20–29)
Calcium: 10 mg/dL (ref 8.7–10.2)
Chloride: 103 mmol/L (ref 96–106)
Creatinine, Ser: 1.16 mg/dL (ref 0.76–1.27)
GFR calc Af Amer: 95 mL/min/{1.73_m2} (ref >59)
GFR calc non Af Amer: 82 mL/min/{1.73_m2} (ref >59)
Globulin, Total: 2.7 g/dL (ref 1.5–4.5)
Glucose: 80 mg/dL (ref 65–99)
Potassium: 4.7 mmol/L (ref 3.5–5.2)
Sodium: 139 mmol/L (ref 134–144)
Total Protein: 6.9 g/dL (ref 6.0–8.5)

## 2019-08-17 ENCOUNTER — Encounter (HOSPITAL_COMMUNITY): Payer: Self-pay

## 2019-08-17 ENCOUNTER — Encounter (HOSPITAL_COMMUNITY): Payer: Self-pay | Admitting: *Deleted

## 2019-10-08 DIAGNOSIS — Z20828 Contact with and (suspected) exposure to other viral communicable diseases: Secondary | ICD-10-CM | POA: Diagnosis not present

## 2020-01-09 ENCOUNTER — Encounter: Payer: 59 | Admitting: Nurse Practitioner

## 2020-01-11 ENCOUNTER — Encounter: Payer: Self-pay | Admitting: Nurse Practitioner

## 2020-01-11 ENCOUNTER — Other Ambulatory Visit: Payer: Self-pay

## 2020-01-11 ENCOUNTER — Ambulatory Visit (INDEPENDENT_AMBULATORY_CARE_PROVIDER_SITE_OTHER): Payer: 59 | Admitting: Nurse Practitioner

## 2020-01-11 VITALS — BP 129/92 | HR 72 | Temp 98.2°F | Resp 16 | Ht 71.0 in | Wt 242.0 lb

## 2020-01-11 DIAGNOSIS — E559 Vitamin D deficiency, unspecified: Secondary | ICD-10-CM | POA: Diagnosis not present

## 2020-01-11 DIAGNOSIS — Z0000 Encounter for general adult medical examination without abnormal findings: Secondary | ICD-10-CM

## 2020-01-11 NOTE — Patient Instructions (Signed)

## 2020-01-11 NOTE — Progress Notes (Signed)
Established Patient Office Visit  Subjective:  Patient ID: Wesley Aguilar, male    DOB: 1986-02-05  Age: 34 y.o. MRN: [id number]  CC:  Chief Complaint  Patient presents with  . Annual Exam    wants to discuss weight changes     HPI Wesley Aguilar presents for annual exam.  He is concerned about his BMI.  He admits that he is watching his diet closely and exercising however he is not losing weight.  He does have muscle mass but is concerned about the scales.  He would like a referral to dietitian for further evaluation and assistance on the best regimen for himself.  He denies any unexplained fevers, nausea, vomiting, constipation or diarrhea.   History reviewed. No pertinent past medical history.  History reviewed. No pertinent surgical history.  Family History  Problem Relation Age of Onset  . Other Mother        no health issues  . Other Father        no health issues    Social History   Socioeconomic History  . Marital status: Married    Spouse name: Not on file  . Number of children: Not on file  . Years of education: Not on file  . Highest education level: Not on file  Occupational History  . Not on file  Tobacco Use  . Smoking status: Former Games developer  . Smokeless tobacco: Never Used  Substance and Sexual Activity  . Alcohol use: Yes    Comment: Socially   . Drug use: No  . Sexual activity: Not on file  Other Topics Concern  . Not on file  Social History Narrative   ** Merged History Encounter **       Social Determinants of Health   Financial Resource Strain:   . Difficulty of Paying Living Expenses: Not on file  Food Insecurity:   . Worried About Programme researcher, broadcasting/film/video in the Last Year: Not on file  . Ran Out of Food in the Last Year: Not on file  Transportation Needs:   . Lack of Transportation (Medical): Not on file  . Lack of Transportation (Non-Medical): Not on file  Physical Activity:   . Days of Exercise per Week: Not on file  . Minutes of  Exercise per Session: Not on file  Stress:   . Feeling of Stress : Not on file  Social Connections:   . Frequency of Communication with Friends and Family: Not on file  . Frequency of Social Gatherings with Friends and Family: Not on file  . Attends Religious Services: Not on file  . Active Member of Clubs or Organizations: Not on file  . Attends Banker Meetings: Not on file  . Marital Status: Not on file  Intimate Partner Violence:   . Fear of Current or Ex-Partner: Not on file  . Emotionally Abused: Not on file  . Physically Abused: Not on file  . Sexually Abused: Not on file    Outpatient Medications Prior to Visit  Medication Sig Dispense Refill  . Calcium Carb-Cholecalciferol (CALCIUM 500 +D PO) Take by mouth.    . Digestive Enzymes (DIGESTIVE ENZYME PO) Take by mouth.    . Potassium 99 MG TABS Take by mouth.    . Probiotic Product (CULTURELLE PROBIOTICS PO) Take by mouth.     No facility-administered medications prior to visit.    No Known Allergies  ROS Review of Systems  Constitutional: Negative.   HENT: Negative.  Eyes: Negative.   Respiratory: Negative.   Cardiovascular: Negative.   Gastrointestinal: Negative.   Endocrine: Negative.   Genitourinary: Negative.   Musculoskeletal: Negative.   Skin: Negative.   Allergic/Immunologic: Negative.   Neurological: Negative.   Hematological: Negative.   Psychiatric/Behavioral: Negative.       Objective:    Physical Exam  Constitutional: He is oriented to person, place, and time. He appears well-developed and well-nourished.  HENT:  Head: Normocephalic.  Eyes: Pupils are equal, round, and reactive to light. Conjunctivae are normal.  Cardiovascular: Normal rate, regular rhythm, normal heart sounds and intact distal pulses.  Pulmonary/Chest: Effort normal.  Abdominal: Soft. Bowel sounds are normal.  Genitourinary:    Penis normal.   Musculoskeletal:        General: Normal range of motion.      Cervical back: Normal range of motion and neck supple.  Neurological: He is alert and oriented to person, place, and time.  Skin: Skin is warm and dry.  Psychiatric: He has a normal mood and affect. His behavior is normal. Judgment and thought content normal.    BP (!) 129/92 (BP Location: Left Arm, Patient Position: Sitting, Cuff Size: Normal)   Pulse 72   Temp 98.2 F (36.8 C) (Oral)   Resp 16   Ht 5\' 11"  (1.803 m)   Wt 242 lb (109.8 kg)   SpO2 99%   BMI 33.75 kg/m  Wt Readings from Last 3 Encounters:  01/11/20 242 lb (109.8 kg)  05/09/19 235 lb (106.6 kg)  02/02/19 230 lb (104.3 kg)     There are no preventive care reminders to display for this patient.  There are no preventive care reminders to display for this patient.  Lab Results  Component Value Date   TSH 1.140 03/29/2018   Lab Results  Component Value Date   WBC 4.4 02/02/2019   HGB 14.9 02/02/2019   HCT 44.8 02/02/2019   MCV 89 02/02/2019   PLT 265 02/02/2019   Lab Results  Component Value Date   NA 139 05/09/2019   K 4.7 05/09/2019   CO2 21 05/09/2019   GLUCOSE 80 05/09/2019   BUN 18 05/09/2019   CREATININE 1.16 05/09/2019   BILITOT 0.4 05/09/2019   ALKPHOS 54 05/09/2019   AST 27 05/09/2019   ALT 23 05/09/2019   PROT 6.9 05/09/2019   ALBUMIN 4.2 05/09/2019   CALCIUM 10.0 05/09/2019   ANIONGAP 6 07/20/2017   Lab Results  Component Value Date   CHOL 204 (H) 03/29/2018   Lab Results  Component Value Date   HDL 50 03/29/2018   Lab Results  Component Value Date   LDLCALC 139 (H) 03/29/2018   Lab Results  Component Value Date   TRIG 77 03/29/2018   Lab Results  Component Value Date   CHOLHDL 4.1 03/29/2018   Lab Results  Component Value Date   HGBA1C 5.4 02/02/2019      Assessment & Plan:   Problem List Items Addressed This Visit    None    Visit Diagnoses    Annual physical exam    -  Primary   Nutrition referral for assistance with weight management   Relevant Orders    Comprehensive metabolic panel   CBC with Differential   Lipid panel   TSH   Amb ref to Medical Nutrition Therapy-MNT   Testosterone   Cortisol, Baseline      No orders of the defined types were placed in this encounter.   Follow-up:  Return in 1 year (on 01/10/2021).    Wesley Francois, NP

## 2020-01-13 LAB — CBC WITH DIFFERENTIAL/PLATELET
Basophils Absolute: 0.1 10*3/uL (ref 0.0–0.2)
Basos: 1 %
EOS (ABSOLUTE): 0.2 10*3/uL (ref 0.0–0.4)
Eos: 5 %
Hematocrit: 45.5 % (ref 37.5–51.0)
Hemoglobin: 15.3 g/dL (ref 13.0–17.7)
Immature Grans (Abs): 0 10*3/uL (ref 0.0–0.1)
Immature Granulocytes: 0 %
Lymphocytes Absolute: 1.7 10*3/uL (ref 0.7–3.1)
Lymphs: 34 %
MCH: 30.2 pg (ref 26.6–33.0)
MCHC: 33.6 g/dL (ref 31.5–35.7)
MCV: 90 fL (ref 79–97)
Monocytes Absolute: 0.5 10*3/uL (ref 0.1–0.9)
Monocytes: 9 %
Neutrophils Absolute: 2.4 10*3/uL (ref 1.4–7.0)
Neutrophils: 51 %
Platelets: 237 10*3/uL (ref 150–450)
RBC: 5.07 x10E6/uL (ref 4.14–5.80)
RDW: 12.4 % (ref 11.6–15.4)
WBC: 4.8 10*3/uL (ref 3.4–10.8)

## 2020-01-13 LAB — COMPREHENSIVE METABOLIC PANEL
ALT: 34 IU/L (ref 0–44)
AST: 31 IU/L (ref 0–40)
Albumin/Globulin Ratio: 1.4 (ref 1.2–2.2)
Albumin: 4.3 g/dL (ref 4.0–5.0)
Alkaline Phosphatase: 61 IU/L (ref 39–117)
BUN/Creatinine Ratio: 11 (ref 9–20)
BUN: 13 mg/dL (ref 6–20)
Bilirubin Total: 0.5 mg/dL (ref 0.0–1.2)
CO2: 20 mmol/L (ref 20–29)
Calcium: 10 mg/dL (ref 8.7–10.2)
Chloride: 104 mmol/L (ref 96–106)
Creatinine, Ser: 1.18 mg/dL (ref 0.76–1.27)
GFR calc Af Amer: 93 mL/min/{1.73_m2} (ref >59)
GFR calc non Af Amer: 81 mL/min/{1.73_m2} (ref >59)
Globulin, Total: 3.1 g/dL (ref 1.5–4.5)
Glucose: 85 mg/dL (ref 65–99)
Potassium: 4.5 mmol/L (ref 3.5–5.2)
Sodium: 141 mmol/L (ref 134–144)
Total Protein: 7.4 g/dL (ref 6.0–8.5)

## 2020-01-13 LAB — TSH: TSH: 0.824 u[IU]/mL (ref 0.450–4.500)

## 2020-01-13 LAB — LIPID PANEL
Chol/HDL Ratio: 3.7 ratio (ref 0.0–5.0)
Cholesterol, Total: 226 mg/dL — ABNORMAL HIGH (ref 100–199)
HDL: 61 mg/dL (ref >39)
LDL Chol Calc (NIH): 151 mg/dL — ABNORMAL HIGH (ref 0–99)
Triglycerides: 82 mg/dL (ref 0–149)
VLDL Cholesterol Cal: 14 mg/dL (ref 5–40)

## 2020-01-13 LAB — CORTISOL, BASELINE: Cortisol, Baseline: 6.6 ug/dL

## 2020-01-13 LAB — TESTOSTERONE: Testosterone: 593 ng/dL (ref 264–916)

## 2020-01-16 ENCOUNTER — Telehealth: Payer: Self-pay

## 2020-01-16 NOTE — Telephone Encounter (Signed)
-----   Message from Barbette Merino, NP sent at 01/13/2020  4:25 PM EST ----- Overall labs are okay total cholesterol elevated 226 which is up from 204 1 year ago LDL is 151.  His HDL is 61 which is good. Dietary consult will be beneficial.  We can repeat lipid panel in 3 to 6 months after dietary changes

## 2020-01-16 NOTE — Telephone Encounter (Signed)
Called and spoke with patient, advised that total cholesterol was 226 which was up from last year and the dietary consult will be beneficial for him. He states he has an appointment on 01/30/20 and will keep that. He was advised that we can follow up on this in 3 to 6 months after dietary changes. Thanks!

## 2020-02-01 ENCOUNTER — Encounter: Payer: Self-pay | Admitting: Dietician

## 2020-02-01 ENCOUNTER — Encounter: Payer: 59 | Attending: Nurse Practitioner | Admitting: Dietician

## 2020-02-01 ENCOUNTER — Other Ambulatory Visit: Payer: Self-pay

## 2020-02-01 DIAGNOSIS — Z713 Dietary counseling and surveillance: Secondary | ICD-10-CM | POA: Diagnosis not present

## 2020-02-01 NOTE — Progress Notes (Signed)
Medical Nutrition Therapy  Appt Start Time: 8:45am   End Time: 9:45am  Primary concerns today: meal planning for weight loss/ body type   Referral diagnosis: Z00.00- annual physical exam Preferred learning style: no preference indicated Learning readiness: change in progress   NUTRITION ASSESSMENT   Anthropometrics   Body Composition Scale 02/01/2020  Weight  lbs 243.1  BMI 34.9  Total Body Fat  % 29.2     Visceral Fat 18  Fat-Free Mass  % 70.7     Total Body Water  % 51.7     Muscle-Mass  lbs 45.8  Body Fat Displacement  ---         Torso  lbs 44         Left Leg  lbs 8.8         Right Leg  lbs 8.8         Left Arm  lbs 4.4         Right Arm  lbs 4.4   Lifestyle & Dietary Hx Patient states he is interested in learning how to eat for his 'body type' and is currently following a meal plan. 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 Patient is physically active and works long shifts (12-15 hours.) States he is concerned about his BMI (34.9) and would like to lose about 25-30 lbs.  Patient states that when he is not following a meal plan, he may have the following in a day: Breakfast- bacon egg and cheese sandwich with potatoes, Lunch- a sandwich, and Dinner- a pasta dish. States he would eat 3 times per day rather than the current 5-6 times per day on his meal plan. States he does well with following a plan. Concerned about eating too many or not enough calories. States he just started his current meal plan a few days ago, and is set at around 2,500 calories. Patient eats very well and "clean."   Supplements: probiotic, digestive enzymes, calcium, potassium  Sleep: limited  Stress / self-care: busy lifestyle  Current average weekly physical activity: workout 4 times/week   24-Hr Dietary Recall First Meal: 3 eggs + peppers/onions/spinach + cheese  Snack: Greek yogurt + granola  Second Meal: chicken breast +  broccoli Snack: banana oat muffin (made with [place] yogurt & blueberries too)  Third Meal: [place] zucchini meatballs + quinoa/ brown rice pasta Beverages: water, coffee w/ creamer & 1 tsp sugar, Bang energy drink  Estimated Energy Needs Calories: 2800 Carbohydrate: 315g Protein: 175g Fat: 93g   NUTRITION DIAGNOSIS  Food/nutrition-related knowledge deficit (NB-1.1) related to lack of prior nutrition education by a nutrition professional as evidenced by questions raised by patient about evidence-based nutrition.    NUTRITION INTERVENTION  Nutrition education (E-1) on the following topics:  . General healthful nutrition: balanced eating, portion sizes, variety of intake . Weight and various factors that affect this    Learning Style & Readiness for Change Teaching method utilized: Visual & Auditory  Demonstrated degree of understanding via: Teach Back  Barriers to learning/adherence to lifestyle change: None Identified     MONITORING & EVALUATION Dietary intake, weekly physical activity, and goals in 6 weeks.  Next Steps  Patient is to return to [facility] for follow up visit in 6 weeks. Patient may contact via email or phone in the meantime with questions/ concerns.

## 2020-03-14 ENCOUNTER — Ambulatory Visit: Payer: 59 | Admitting: Dietician

## 2020-08-20 DIAGNOSIS — Z03818 Encounter for observation for suspected exposure to other biological agents ruled out: Secondary | ICD-10-CM | POA: Diagnosis not present

## 2020-12-10 DIAGNOSIS — R059 Cough, unspecified: Secondary | ICD-10-CM | POA: Diagnosis not present

## 2020-12-10 DIAGNOSIS — Z03818 Encounter for observation for suspected exposure to other biological agents ruled out: Secondary | ICD-10-CM | POA: Diagnosis not present

## 2020-12-10 DIAGNOSIS — J22 Unspecified acute lower respiratory infection: Secondary | ICD-10-CM | POA: Diagnosis not present

## 2020-12-11 ENCOUNTER — Other Ambulatory Visit (HOSPITAL_COMMUNITY): Payer: Self-pay | Admitting: Family Medicine

## 2020-12-12 ENCOUNTER — Other Ambulatory Visit (HOSPITAL_COMMUNITY): Payer: Self-pay | Admitting: Nurse Practitioner

## 2020-12-12 ENCOUNTER — Ambulatory Visit
Admission: RE | Admit: 2020-12-12 | Discharge: 2020-12-12 | Disposition: A | Discharge status: Home / Self Care | Payer: 59 | Source: Ambulatory Visit | Attending: Nurse Practitioner | Admitting: Nurse Practitioner

## 2020-12-12 ENCOUNTER — Ambulatory Visit (INDEPENDENT_AMBULATORY_CARE_PROVIDER_SITE_OTHER): Payer: 59 | Admitting: Nurse Practitioner

## 2020-12-12 VITALS — BP 128/88 | HR 85 | Temp 97.1°F | Wt 259.0 lb

## 2020-12-12 DIAGNOSIS — J069 Acute upper respiratory infection, unspecified: Secondary | ICD-10-CM

## 2020-12-12 DIAGNOSIS — R0602 Shortness of breath: Secondary | ICD-10-CM | POA: Diagnosis not present

## 2020-12-12 DIAGNOSIS — R059 Cough, unspecified: Secondary | ICD-10-CM | POA: Diagnosis not present

## 2020-12-12 MED ORDER — AZITHROMYCIN 250 MG PO TABS: ORAL_TABLET | ORAL | 0 refills | Status: DC

## 2020-12-12 MED ORDER — PREDNISONE 20 MG PO TABS: 20.0000 mg | ORAL_TABLET | Freq: Every day | ORAL | 0 refills | Status: AC

## 2020-12-12 MED ORDER — ALBUTEROL SULFATE HFA 108 (90 BASE) MCG/ACT IN AERS: 2.0000 | INHALATION_SPRAY | Freq: Four times a day (QID) | RESPIRATORY_TRACT | 0 refills | Status: DC | PRN

## 2020-12-12 MED FILL — PROMETHAZINE W/DM SYRUP: 6.25-15 | 7 days supply | Qty: 140 | Fill #0

## 2020-12-12 MED FILL — predniSONE 20 MG TABS: 20 | 5 days supply | Qty: 5 | Fill #0

## 2020-12-12 MED FILL — ALBUTEROL SULFATE HFA 108 (: 108 (90 BAS | 25 days supply | Qty: 18 | Fill #0

## 2020-12-12 MED FILL — AZITHROMYCIN 250 MG TABLET: 250 | 5 days supply | Qty: 6 | Fill #0

## 2020-12-12 NOTE — Assessment & Plan Note (Signed)
Cough:   Stay well hydrated  Stay active  Deep breathing exercises  May take tylenol for fever or pain  May take mucinex twice daily    Will order chest x ray:  [facility] [address] [place], [place] [address] [phone] MON - FRI 8:00 AM - 4:00 PM - WALK IN   Follow up:  Follow up if needed

## 2020-12-12 NOTE — Progress Notes (Signed)
@Patient  ID: , male    DOB: 1986-05-25, 35 y.o.   MRN: [id number]  Chief Complaint  Patient presents with  . Shortness of Breath    Both wife and daughter had been sick, Negative COVID and FLU. Was seen at [facility]. Has been short of breath and wheezing.     Referring provider: [facility], FNP   35 year old male with no significant health history.  HPI  Patient presents today for respiratory clinic visit.  Patient was recently seen by [facility] family practice for cough and fever.  He did test negative for COVID and flu.  His symptoms started on 12/07/2020.  Patient states that he has been short of breath and wheezing as well.  He was prescribed Phenergan-based cough syrup but failed to pick up his prescription.  He states that he will pick this up today. Denies f/c/s, n/v/d, hemoptysis, PND, chest pain or edema.       No Known Allergies  Immunization History  Administered Date(s) Administered  . Influenza,inj,Quad PF,6+ Mos 02/02/2019  . Tdap 03/29/2018    No past medical history on file.  Tobacco History: Social History   Tobacco Use  Smoking Status Former Smoker  Smokeless Tobacco Never Used   Counseling given: Not Answered   Outpatient Encounter Medications as of 12/12/2020  Medication Sig  . albuterol (VENTOLIN HFA) 108 (90 Base) MCG/ACT inhaler Inhale 2 puffs into the lungs every 6 (six) hours as needed for wheezing or shortness of breath.  02/09/2021 azithromycin (ZITHROMAX) 250 MG tablet Take 2 tablets (500 mg) on day 1, then take 1 tablet (250 mg) on days 2-5  . Calcium Carb-Cholecalciferol (CALCIUM 500 +D PO) Take by mouth.  . Potassium 99 MG TABS Take by mouth.  . predniSONE (DELTASONE) 20 MG tablet Take 1 tablet (20 mg total) by mouth daily with breakfast for 5 days.  . Digestive Enzymes (DIGESTIVE ENZYME PO) Take by mouth. (Patient not taking: Reported on 12/12/2020)  . Probiotic Product (CULTURELLE PROBIOTICS PO) Take by mouth. (Patient not  taking: Reported on 12/12/2020)  . promethazine-dextromethorphan (PROMETHAZINE-DM) 6.25-15 MG/5ML syrup Take by mouth.   No facility-administered encounter medications on file as of 12/12/2020.     Review of Systems  Review of Systems  Constitutional: Negative.  Negative for fever.  HENT: Negative.   Respiratory: Positive for cough and shortness of breath.   Cardiovascular: Negative.  Negative for chest pain, palpitations and leg swelling.  Gastrointestinal: Negative.   Allergic/Immunologic: Negative.   Neurological: Negative.   Psychiatric/Behavioral: Negative.        Physical Exam  BP 128/88 (BP Location: Left Arm)   Pulse 85   Temp (!) 97.1 F (36.2 C)   Wt 259 lb (117.5 kg)   SpO2 96%   BMI 37.16 kg/m   Wt Readings from Last 5 Encounters:  12/12/20 259 lb (117.5 kg)  02/01/20 243 lb 1.6 oz (110.3 kg)  01/11/20 242 lb (109.8 kg)  05/09/19 235 lb (106.6 kg)  02/02/19 230 lb (104.3 kg)     Physical Exam Vitals and nursing note reviewed.  Constitutional:      General: He is not in acute distress.    Appearance: He is well-developed and well-nourished.  Cardiovascular:     Rate and Rhythm: Normal rate and regular rhythm.  Pulmonary:     Effort: Pulmonary effort is normal.     Breath sounds: Normal breath sounds.  Skin:    General: Skin is warm and dry.  Neurological:     Mental Status: He is alert and oriented to person, place, and time.  Psychiatric:        Mood and Affect: Mood and affect normal.        Assessment & Plan:   Viral upper respiratory tract infection Cough:   Stay well hydrated  Stay active  Deep breathing exercises  May take tylenol for fever or pain  May take mucinex twice daily    Will order chest x ray:  [facility] [address] [place], [place] [address] [phone] MON - FRI 8:00 AM - 4:00 PM - WALK IN   Follow up:  Follow up if needed      Ivonne Andrew, NP 12/12/2020

## 2020-12-12 NOTE — Patient Instructions (Addendum)
URI - tested negative for Covid and Flu Cough:   Stay well hydrated  Stay active  Deep breathing exercises  May take tylenol for fever or pain  May take mucinex twice daily    Will order chest x ray:  [facility] [address] [place], [place] [address] [phone] MON - FRI 8:00 AM - 4:00 PM - WALK IN   Follow up:  Follow up if needed

## 2021-03-12 ENCOUNTER — Other Ambulatory Visit: Payer: Self-pay

## 2021-03-12 ENCOUNTER — Encounter: Payer: Self-pay | Admitting: Family Medicine

## 2021-03-12 ENCOUNTER — Ambulatory Visit (INDEPENDENT_AMBULATORY_CARE_PROVIDER_SITE_OTHER): Payer: 59 | Admitting: Family Medicine

## 2021-03-12 VITALS — BP 132/88 | HR 59 | Ht 70.0 in | Wt 248.0 lb

## 2021-03-12 DIAGNOSIS — Z0000 Encounter for general adult medical examination without abnormal findings: Secondary | ICD-10-CM

## 2021-03-12 DIAGNOSIS — F32A Depression, unspecified: Secondary | ICD-10-CM

## 2021-03-12 NOTE — Progress Notes (Signed)
[facility] .  ANNUAL PREVENTATIVE VISIT AND CPE  Subjective:  Wesley Aguilar is a 35 y.o. male who presents for Medicare Annual Wellness Visit and complete physical.  Patient's last physical exam was 1 year prior. Mr. Higginson reports that he typically gets an annual physical exam.  He does not go to the dentist twice yearly he says that it is been several years.  He has not had a routine eye exam.  Also, patient does not perform monthly self testicular exams. Patient currently does not exercise or follow a low-fat, low carbohydrate diet.  He does not wear sunscreen.  He wears a seatbelt. Patient admits that he has had some depression lately.  He has had some anhedonia.  He does not have a history of depression.  He denies any suicidal or homicidal ideations.  He also denies any visual or auditory hallucinations.  Patient says that he has gained weight over the past year.  He attributes weight gain to chronic marijuana use.  He has made an effort to decrease marijuana smoking.  He does not smoke tobacco. He does not have a family history of colorectal cancer.  He also denies a history of prostate cancer.   Depression screen [facility] 2/9 03/12/2021 02/01/2020 01/11/2020 05/09/2019 02/02/2019  Decreased Interest 1 0 0 0 0  Down, Depressed, Hopeless 1 0 0 1 0  PHQ - 2 Score 2 0 0 1 0  Altered sleeping 1 - - - -  Tired, decreased energy 2 - - - -  Change in appetite 1 - - - -  Feeling bad or failure about yourself  2 - - - -  Trouble concentrating 0 - - - -  Moving slowly or fidgety/restless 0 - - - -  Suicidal thoughts 0 - - - -  PHQ-9 Score 8 - - - -   Lab Results  Component Value Date   CHOL 226 (H) 01/11/2020   HDL 61 01/11/2020   LDLCALC 151 (H) 01/11/2020   TRIG 82 01/11/2020   CHOLHDL 3.7 01/11/2020    Lab Results  Component Value Date   HGBA1C 5.4 02/02/2019   Patient is on Vitamin D supplement.   Lab Results  Component Value Date    VD25OH 16.8 (L) 03/29/2018       Names of Other Physician/Practitioners you currently use: Patient Care Team: Massie Maroon, FNP as PCP - General (Family Medicine)   Medication Review: No current outpatient medications on file prior to visit.   No current facility-administered medications on file prior to visit.    Current Problems (verified) Patient Active Problem List   Diagnosis Date Noted  . Viral upper respiratory tract infection 12/12/2020  . Cough 12/12/2020  . Shortness of breath 12/12/2020  . Vitamin D deficiency 03/30/2018  . Hyperlipidemia LDL goal <100 03/30/2018    Screening Tests Health Maintenance  Topic Date Due  . Hepatitis C Screening  Never done  . COVID-19 Vaccine (1) Never done  . INFLUENZA VACCINE  07/08/2021  . TETANUS/TDAP  03/29/2028  . HIV Screening  Completed  . HPV VACCINES  Aged Out    Immunization History  Administered Date(s) Administered  . Influenza,inj,Quad PF,6+ Mos 02/02/2019  . Tdap 03/29/2018     Allergies as of 03/12/2021   No Known Allergies     Medication List       Accurate as of March 12, 2021  9:54 AM. If you have  any questions, ask your nurse or doctor.        STOP taking these medications   albuterol 108 (90 Base) MCG/ACT inhaler Commonly known as: VENTOLIN HFA Stopped by: Julianne Handler, FNP   azithromycin 250 MG tablet Commonly known as: ZITHROMAX Stopped by: Julianne Handler, FNP   CALCIUM 500 +D PO Stopped by: Julianne Handler, FNP   CULTURELLE PROBIOTICS PO Stopped by: Julianne Handler, FNP   DIGESTIVE ENZYME PO Stopped by: Julianne Handler, FNP   Potassium 99 MG Tabs Stopped by: Julianne Handler, FNP   promethazine-dextromethorphan 6.25-15 MG/5ML syrup Commonly known as: PROMETHAZINE-DM Stopped by: Julianne Handler, FNP       No past surgical history on file. Family History  Problem Relation Age of Onset  . Other Mother        no health issues  . Other Father        no health issues     History reviewed: allergies, current medications, past family history, past medical history, past social history, past surgical history and problem list  Tobacco Social History   Tobacco Use  . Smoking status: Former Games developer  . Smokeless tobacco: Never Used  Vaping Use  . Vaping Use: Never used  Substance Use Topics  . Alcohol use: Yes    Comment: Socially   . Drug use: No     Alcohol Current alcohol ZJQ:BHALPF, rarely  Caffeine Current caffeine use: Occasionally  Exercise Current exercise: none  Nutrition/Diet Current diet: in general, an "unhealthy" diet  Cardiac risk factors: male gender, obesity (BMI >= 30 kg/m2) and sedentary lifestyle.    Objective:     Blood pressure 132/88, pulse (!) 59, height 5\' 10"  (1.778 m), weight 248 lb (112.5 kg), SpO2 100 %. Body mass index is 35.58 kg/m.  General appearance: alert, no distress, WD/WN, male  HEENT: normocephalic, sclerae anicteric, TMs pearly, nares patent, no discharge or erythema, pharynx normal Oral cavity: MMM, no lesions Neck: supple, no lymphadenopathy, no thyromegaly, no masses Heart: RRR, normal S1, S2, no murmurs Lungs: CTA bilaterally, no wheezes, rhonchi, or rales Abdomen: +bs, soft, non tender, non distended, no masses, no hepatomegaly, no splenomegaly Musculoskeletal: nontender, no swelling, no obvious deformity Extremities: no edema, no cyanosis, no clubbing Pulses: 2+ symmetric, upper and lower extremities, normal cap refill Neurological: alert, oriented x 3, CN2-12 intact, strength normal upper extremities and lower extremities, sensation normal throughout, DTRs 2+ throughout, no cerebellar signs, gait normal Testicular: B/L testicle descended. No masses Psychiatric: normal affect, behavior normal, pleasant   Assessment:  Patient denies any difficulties at home. No trouble with ADLs, depression or falls. No recent changes to vision or hearing. Is UTD with immunizations. Is UTD with screening.  Discussed Advanced Directives, patient agrees to bring copies of documents if can. Encouraged heart healthy diet, exercise as tolerated and adequate sleep. Declines flu shot. Testicular and rectal examination done today.     Plan:    Depression determined by examination Discussed depression at length. PHQ 9 reviewed.  Medications not warranted on today. Patient may benefit from psychiatry - Ambulatory referral to Psychiatry  Routine physical examination - Hemoglobin A1c - TSH - Lipid Panel - Comprehensive metabolic panel - CBC with Differential - Vitamin D, 25-hydroxy - EKG 12-Lead - Hepatitis C Antibody - Urinalysis Dipstick  Plan:  Referral for counseling Yearly eye exam Monthly self testicular exam Smoking cessation Exercise, low impact cardiovascular 3-5 times per week Low-fat, low carbohydrate diet divided over small meals throughout the day Recommend sunscreen  during high exposure     The patient's weight, height, BMI, and visual acuity have been recorded in the chart.  I have made referrals, counseling, and provided education to the patient based on review of the above and I have provided the patient with a written personalized care plan for preventive services.     Nolon Nations  APRN, MSN, FNP-C Patient Care [facility] [address] [place], [place] [address] [phone]

## 2021-03-12 NOTE — Patient Instructions (Addendum)
Depression Screening Depression screening is a tool that your health care provider can use to learn if you have symptoms of depression. Depression is a common condition with many symptoms that are also often found in other conditions. Depression is treatable, but it must first be diagnosed. You may not know that certain feelings, thoughts, and behaviors that you are having can be symptoms of depression. Taking a depression screening test can help you and your health care provider decide if you need more assessment, or if you should be referred to a mental health care provider. What are the screening tests?  You may have a physical exam to see if another condition is affecting your mental health. You may have a blood or urine sample taken during the physical exam.  You may be interviewed using a screening tool that was developed from research, such as one of these: ? Patient Health Questionnaire (PHQ). This is a set of either 2 or 9 questions. A health care provider who has been trained to score this screening test uses a guide to assess if your symptoms suggest that you may have depression. ? Hamilton Depression Rating Scale (HAM-D). This is a set of either 17 or 24 questions. You may be asked to take it again during or after your treatment, to see if your depression has gotten better. ? Beck Depression Inventory (BDI). This is a set of 21 multiple choice questions. Your health care provider scores your answers to assess:  Your level of depression, ranging from mild to severe.  Your response to treatment.  Your health care provider may talk with you about your daily activities, such as eating, sleeping, work, and recreation, and ask if you have had any changes in activity.  Your health care provider may ask you to see a mental health specialist, such as a psychiatrist or psychologist, for more evaluation. Who should be screened for depression?  All adults, including adults with a family  history of a mental health disorder.  Adolescents who are 35-3 years old.  People who are recovering from a myocardial infarction (MI).  Pregnant women, or women who have given birth.  People who have a long-term (chronic) illness.  Anyone who has been diagnosed with another type of a mental health disorder.  Anyone who has symptoms that could show depression.   What do my results mean? Your health care provider will review the results of your depression screening, physical exam, and lab tests. Positive screens suggest that you may have depression. Screening is the first step in getting the care that you may need. It is up to you to get your screening results. Ask your health care provider, or the department that is doing your screening tests, when your results will be ready. Talk with your health care provider about your results and diagnosis. A diagnosis of depression is made using the Diagnostic and Statistical Manual of Mental Disorders (DSM-V). This is a book that lists the number and type of symptoms that must be present for a health care provider to give a specific diagnosis.  Your health care provider may work with you to treat your symptoms of depression, or your health care provider may help you find a mental health provider who can assess, diagnose, and treat your depression. Get help right away if:  You have thoughts about hurting yourself or others. If you ever feel like you may hurt yourself or others, or have thoughts about taking your own life, get  help right away. You can go to your nearest emergency department or call:  Your local emergency services (911 in the [place]  A suicide crisis helpline, such as the National Suicide Prevention Lifeline at 9033749774. This is open 24 hours a day. Summary  Depression screening is the first step in getting the help that you may need.  If your screening test shows symptoms of depression (is positive), your health care provider  may ask you to see a mental health provider.  Anyone who is age 35 or older should be screened for depression. This information is not intended to replace advice given to you by your health care provider. Make sure you discuss any questions you have with your health care provider. Document Revised: 05/17/2020 Document Reviewed: 05/17/2020 Elsevier Patient Education  2021 [employer].

## 2021-03-13 LAB — LIPID PANEL
Chol/HDL Ratio: 4.7 ratio (ref 0.0–5.0)
Cholesterol, Total: 242 mg/dL — ABNORMAL HIGH (ref 100–199)
HDL: 51 mg/dL (ref >39)
LDL Chol Calc (NIH): 177 mg/dL — ABNORMAL HIGH (ref 0–99)
Triglycerides: 79 mg/dL (ref 0–149)
VLDL Cholesterol Cal: 14 mg/dL (ref 5–40)

## 2021-03-13 LAB — CBC WITH DIFFERENTIAL/PLATELET
Basophils Absolute: 0 10*3/uL (ref 0.0–0.2)
Basos: 1 %
EOS (ABSOLUTE): 0.2 10*3/uL (ref 0.0–0.4)
Eos: 3 %
Hematocrit: 48.4 % (ref 37.5–51.0)
Hemoglobin: 16.3 g/dL (ref 13.0–17.7)
Immature Grans (Abs): 0 10*3/uL (ref 0.0–0.1)
Immature Granulocytes: 0 %
Lymphocytes Absolute: 1.6 10*3/uL (ref 0.7–3.1)
Lymphs: 31 %
MCH: 30.6 pg (ref 26.6–33.0)
MCHC: 33.7 g/dL (ref 31.5–35.7)
MCV: 91 fL (ref 79–97)
Monocytes Absolute: 0.4 10*3/uL (ref 0.1–0.9)
Monocytes: 8 %
Neutrophils Absolute: 3 10*3/uL (ref 1.4–7.0)
Neutrophils: 57 %
Platelets: 255 10*3/uL (ref 150–450)
RBC: 5.33 x10E6/uL (ref 4.14–5.80)
RDW: 12.6 % (ref 11.6–15.4)
WBC: 5.2 10*3/uL (ref 3.4–10.8)

## 2021-03-13 LAB — COMPREHENSIVE METABOLIC PANEL
ALT: 33 IU/L (ref 0–44)
AST: 29 IU/L (ref 0–40)
Albumin/Globulin Ratio: 1.5 (ref 1.2–2.2)
Albumin: 4.5 g/dL (ref 4.0–5.0)
Alkaline Phosphatase: 64 IU/L (ref 44–121)
BUN/Creatinine Ratio: 10 (ref 9–20)
BUN: 12 mg/dL (ref 6–20)
Bilirubin Total: 0.7 mg/dL (ref 0.0–1.2)
CO2: 23 mmol/L (ref 20–29)
Calcium: 10 mg/dL (ref 8.7–10.2)
Chloride: 99 mmol/L (ref 96–106)
Creatinine, Ser: 1.22 mg/dL (ref 0.76–1.27)
Globulin, Total: 3 g/dL (ref 1.5–4.5)
Glucose: 96 mg/dL (ref 65–99)
Potassium: 4.5 mmol/L (ref 3.5–5.2)
Sodium: 140 mmol/L (ref 134–144)
Total Protein: 7.5 g/dL (ref 6.0–8.5)
eGFR: 79 mL/min/{1.73_m2} (ref >59)

## 2021-03-13 LAB — VITAMIN D 25 HYDROXY (VIT D DEFICIENCY, FRACTURES): Vit D, 25-Hydroxy: 17.6 ng/mL — ABNORMAL LOW (ref 30.0–100.0)

## 2021-03-13 LAB — HEPATITIS C ANTIBODY: Hep C Virus Ab: <0.1 s/co ratio (ref 0.0–0.9)

## 2021-03-13 LAB — TSH: TSH: 0.733 u[IU]/mL (ref 0.450–4.500)

## 2021-03-13 LAB — HEMOGLOBIN A1C
Est. average glucose Bld gHb Est-mCnc: 108 mg/dL
Hgb A1c MFr Bld: 5.4 % (ref 4.8–5.6)

## 2021-03-18 ENCOUNTER — Other Ambulatory Visit: Payer: Self-pay | Admitting: Family Medicine

## 2021-03-18 ENCOUNTER — Telehealth: Payer: Self-pay | Admitting: Family Medicine

## 2021-03-18 DIAGNOSIS — E559 Vitamin D deficiency, unspecified: Secondary | ICD-10-CM

## 2021-03-18 MED ORDER — VITAMIN D (ERGOCALCIFEROL) 1.25 MG (50000 UNIT) PO CAPS: 50000.0000 [IU] | ORAL_CAPSULE | ORAL | 1 refills | Status: AC

## 2021-03-18 NOTE — Telephone Encounter (Signed)
Wesley Aguilar is a 35 year old male that was seen in office on 03/12/2021 for annual physical exam. Reviewed all laboratory values.  Cholesterol elevated at 242, goal is less than 200.  LDL cholesterol elevated at 177, goal is less than 100.  Recommended a low-fat, low carbohydrate diet divided over small meals throughout the day.  Also, recommended low impact cardiovascular exercise 3-5 days/week.  Increase fluid intake to 64 ounces of water per day.  Discussed diet at length, patient expressed understanding. Vitamin D deficiency noted.  Vitamin D 17.6, which is markedly decreased.  We will start weekly Drisdol 50,000 IUs, patient will return to clinic in 3 months to repeat vitamin D.  Nolon Nations  APRN, MSN, FNP-C Patient Care [facility] [address] [place], [place] [address] [phone]

## 2021-03-18 NOTE — Progress Notes (Signed)
Meds ordered this encounter  Medications  . Vitamin D, Ergocalciferol, (DRISDOL) 1.25 MG (50000 UNIT) CAPS capsule    Sig: Take 1 capsule (50,000 Units total) by mouth every 7 (seven) days.    Dispense:  12 capsule    Refill:  1    Order Specific Question:   Supervising Provider    Answer:   Quentin Angst [[id number]]    Nolon Nations  APRN, MSN, FNP-C Patient Care [facility] [address] [place], [place] [address] [phone]

## 2021-04-07 ENCOUNTER — Encounter (HOSPITAL_BASED_OUTPATIENT_CLINIC_OR_DEPARTMENT_OTHER): Payer: Self-pay | Admitting: Obstetrics and Gynecology

## 2021-04-07 ENCOUNTER — Other Ambulatory Visit: Payer: Self-pay

## 2021-04-07 ENCOUNTER — Emergency Department (HOSPITAL_BASED_OUTPATIENT_CLINIC_OR_DEPARTMENT_OTHER)
Admission: EM | Admit: 2021-04-07 | Discharge: 2021-04-07 | Disposition: A | Discharge status: Home / Self Care | Payer: 59 | Attending: Emergency Medicine | Admitting: Emergency Medicine

## 2021-04-07 DIAGNOSIS — Z87891 Personal history of nicotine dependence: Secondary | ICD-10-CM | POA: Diagnosis not present

## 2021-04-07 DIAGNOSIS — U071 COVID-19: Secondary | ICD-10-CM | POA: Insufficient documentation

## 2021-04-07 DIAGNOSIS — R509 Fever, unspecified: Secondary | ICD-10-CM | POA: Diagnosis not present

## 2021-04-07 MED ORDER — CLINDAMYCIN HCL 150 MG PO CAPS
450.0000 mg | ORAL_CAPSULE | Freq: Once | ORAL | Status: AC
  Administered 2021-04-07: 450 mg via ORAL
  Filled 2021-04-07: qty 3

## 2021-04-07 MED ORDER — CLINDAMYCIN HCL 150 MG PO CAPS: 450.0000 mg | ORAL_CAPSULE | Freq: Three times a day (TID) | ORAL | 0 refills | Status: AC

## 2021-04-07 NOTE — ED Triage Notes (Signed)
Patient reports to the ER for fatigue, nausea, and tachycardia following having 2 teeth pulled. Patient reports he broke out in a sweat and has been having general malaise.

## 2021-04-07 NOTE — ED Provider Notes (Signed)
[facility] EMERGENCY DEPT Provider Note   CSN: [id number] Arrival date & time: 04/07/21  1641     History Chief Complaint  Patient presents with  . Fatigue    Wesley Aguilar is a 35 y.o. male.  35 yo M with a chief complaints of fever and aches and malaise.  Going on for about 5 days.  Patient having some cough and congestion as well.  No known sick contacts.  No nausea or vomiting.  No prior history of issues with his heart valve.  Had his wisdom teeth removed prior to the onset of this.  No significant bleeding or drainage or swelling from his procedure.  Was started on penicillin but has not been taking it regularly.  The history is provided by the patient.  Illness Severity:  Moderate Onset quality:  Gradual Duration:  5 days Timing:  Constant Progression:  Worsening Chronicity:  New Associated symptoms: congestion, cough and fever   Associated symptoms: no abdominal pain, no chest pain, no diarrhea, no headaches, no myalgias, no rash, no shortness of breath and no vomiting        History reviewed. No pertinent past medical history.  Patient Active Problem List   Diagnosis Date Noted  . Viral upper respiratory tract infection 12/12/2020  . Cough 12/12/2020  . Shortness of breath 12/12/2020  . Vitamin D deficiency 03/30/2018  . Hyperlipidemia LDL goal <100 03/30/2018    History reviewed. No pertinent surgical history.     Family History  Problem Relation Age of Onset  . Other Mother        no health issues  . Other Father        no health issues    Social History   Tobacco Use  . Smoking status: Former Smoker    Packs/day: 0.30    Years: 17.00    Pack years: 5.10    Types: Cigarettes    Quit date: 03/27/2021    Years since quitting: 0.0  . Smokeless tobacco: Never Used  Vaping Use  . Vaping Use: Never used  Substance Use Topics  . Alcohol use: Yes    Comment: Socially   . Drug use: No    Home Medications Prior to Admission  medications   Medication Sig Start Date End Date Taking? Authorizing Provider  clindamycin (CLEOCIN) 150 MG capsule Take 3 capsules (450 mg total) by mouth 3 (three) times daily for 7 days. 04/07/21 04/14/21 Yes Melene Plan, DO  albuterol (VENTOLIN HFA) 108 (90 Base) MCG/ACT inhaler INHALE 2 PUFFS INTO THE LUNGS EVERY 6 HOURS AS NEEDED FOR WHEEZING OR SHORTNESS OF BREATH. 12/12/20 12/12/21  Ivonne Andrew, NP  azithromycin (ZITHROMAX) 250 MG tablet TAKE 2 TABLETS BY MOUTH ON DAY 1 THEN TAKE 1 TABLET DAILY FOR THE NEXT 4 DAYS 12/12/20 12/12/21  Ivonne Andrew, NP  predniSONE (DELTASONE) 20 MG tablet TAKE 1 TABLET BY MOUTH ONCE A DAY WITH BREAKFAST FOR 5 DAYS 12/12/20 12/12/21  Ivonne Andrew, NP  promethazine-dextromethorphan (PROMETHAZINE-DM) 6.25-15 MG/5ML syrup TAKE 5 MLS BY MOUTH EVERY 6 HOURS AS NEEDED FOR 7 DAYS 12/11/20 12/11/21  Charlesetta Shanks, MD  Vitamin D, Ergocalciferol, (DRISDOL) 1.25 MG (50000 UNIT) CAPS capsule Take 1 capsule (50,000 Units total) by mouth every 7 (seven) days. 03/18/21   Massie Maroon, FNP    Allergies    Patient has no known allergies.  Review of Systems   Review of Systems  Constitutional: Positive for chills and fever.  HENT: Positive  for congestion and dental problem. Negative for facial swelling.   Eyes: Negative for discharge and visual disturbance.  Respiratory: Positive for cough. Negative for shortness of breath.   Cardiovascular: Negative for chest pain and palpitations.  Gastrointestinal: Negative for abdominal pain, diarrhea and vomiting.  Musculoskeletal: Negative for arthralgias and myalgias.  Skin: Negative for color change and rash.  Neurological: Negative for tremors, syncope and headaches.  Psychiatric/Behavioral: Negative for confusion and dysphoric mood.    Physical Exam Updated Vital Signs BP (!) 125/93 (BP Location: Left Arm)   Pulse (!) 108   Temp 100.2 F (37.9 C) (Oral)   Resp 16   Ht 5\' 10"  (1.778 m)   Wt 103.4 kg   SpO2 100%   BMI  32.71 kg/m   Physical Exam Vitals and nursing note reviewed.  Constitutional:      Appearance: He is well-developed.  HENT:     Head: Normocephalic and atraumatic.     Mouth/Throat:     Comments: Sutures in place at the position of the left and right third posterior molar.  No obvious edema fluctuance or drainage.  Tolerating secretions without difficulty.  No trismus. Eyes:     Pupils: Pupils are equal, round, and reactive to light.  Neck:     Vascular: No JVD.  Cardiovascular:     Rate and Rhythm: Normal rate and regular rhythm.     Heart sounds: No murmur heard. No friction rub. No gallop.      Comments: No appreciable murmur Pulmonary:     Effort: No respiratory distress.     Breath sounds: No wheezing.  Abdominal:     General: There is no distension.     Tenderness: There is no guarding or rebound.  Musculoskeletal:        General: Normal range of motion.     Cervical back: Normal range of motion and neck supple.  Skin:    Coloration: Skin is not pale.     Findings: No rash.  Neurological:     Mental Status: He is alert and oriented to person, place, and time.  Psychiatric:        Behavior: Behavior normal.     ED Results / Procedures / Treatments   Labs (all labs ordered are listed, but only abnormal results are displayed) Labs Reviewed  CULTURE, BLOOD (ROUTINE X 2)  CULTURE, BLOOD (ROUTINE X 2)  SARS CORONAVIRUS 2 (TAT 6-24 HRS)    EKG None  Radiology No results found.  Procedures Procedures   Medications Ordered in ED Medications  clindamycin (CLEOCIN) capsule 450 mg (450 mg Oral Given 04/07/21 1734)    ED Course  I have reviewed the triage vital signs and the nursing notes.  Pertinent labs & imaging results that were available during my care of the patient were reviewed by me and considered in my medical decision making (see chart for details).    MDM Rules/Calculators/A&P                          35 yo M with a chief complaints of a fever  aches after having a dental extraction done about 5 days ago.  He is well-appearing and nontoxic.  Borderline febrile here orally.  We will broaden his antibiotics.  Send off blood cultures.  COVID test.  5:48 PM:  I have discussed the diagnosis/risks/treatment options with the patient and family and believe the pt to be eligible for discharge home to  follow-up with PCP, Dentist. We also discussed returning to the ED immediately if new or worsening sx occur. We discussed the sx which are most concerning (e.g., sudden worsening pain,  inability to tolerate by mouth) that necessitate immediate return. Medications administered to the patient during their visit and any new prescriptions provided to the patient are listed below.  Medications given during this visit Medications  clindamycin (CLEOCIN) capsule 450 mg (450 mg Oral Given 04/07/21 1734)     The patient appears reasonably screen and/or stabilized for discharge and I doubt any other medical condition or other [facility] requiring further screening, evaluation, or treatment in the ED at this time prior to discharge.   Final Clinical Impression(s) / ED Diagnoses Final diagnoses:  Fever in adult    Rx / DC Orders ED Discharge Orders         Ordered    clindamycin (CLEOCIN) 150 MG capsule  3 times daily        04/07/21 1715           Melene Plan, DO 04/07/21 1748

## 2021-04-07 NOTE — Discharge Instructions (Signed)
Take tylenol 2 pills 4 times a day and motrin 4 pills 3 times a day.  Drink plenty of fluids.  Return for worsening shortness of breath, headache, confusion. Follow up with your family doctor.   

## 2021-04-08 LAB — SARS CORONAVIRUS 2 (TAT 6-24 HRS): SARS Coronavirus 2: POSITIVE — AB

## 2021-04-09 ENCOUNTER — Telehealth: Payer: Self-pay | Admitting: Nurse Practitioner

## 2021-04-09 ENCOUNTER — Telehealth: Payer: Self-pay | Admitting: Adult Health

## 2021-04-09 NOTE — Telephone Encounter (Signed)
Called to discuss with patient about COVID-19 symptoms and the use of one of the available treatments for those with mild to moderate Covid symptoms and at a high risk of hospitalization.  Pt appears to qualify for outpatient treatment due to co-morbid conditions and/or a member of an at-risk group in accordance with the FDA Emergency Use Authorization.    Symptom onset: today is day 6 He is going to think about treatment and will call back if he decides he wants to proceed with it.  Noreene Filbert

## 2021-04-09 NOTE — Telephone Encounter (Signed)
Called to discuss with patient about COVID-19 symptoms and the use of one of the available treatments for those with mild to moderate Covid symptoms and at a high risk of hospitalization.  Pt appears to qualify for outpatient treatment due to co-morbid conditions and/or a member of an at-risk group in accordance with the FDA Emergency Use Authorization.    Symptom onset: day 6 Vaccinated: not on file Booster? Not on file Immunocompromised? No  Qualifiers: AA,smoker, BMI 32  NIH Criteria:   Unable to reach pt - Voicemail left.   Wesley Alma, NP COVID Treatment Team [phone]

## 2021-04-12 LAB — CULTURE, BLOOD (ROUTINE X 2)
Culture: NO GROWTH
Culture: NO GROWTH
Special Requests: ADEQUATE
Special Requests: ADEQUATE

## 2022-03-18 ENCOUNTER — Encounter: Payer: 59 | Admitting: Family Medicine

## 2022-05-06 IMAGING — CR DG CHEST 2V
2 series · 2 of 2 positions shown · non-contrast
Comparison: None.

CLINICAL DATA: Cough, shortness of breath

EXAM:
CHEST - 2 VIEW

[w chest pa]
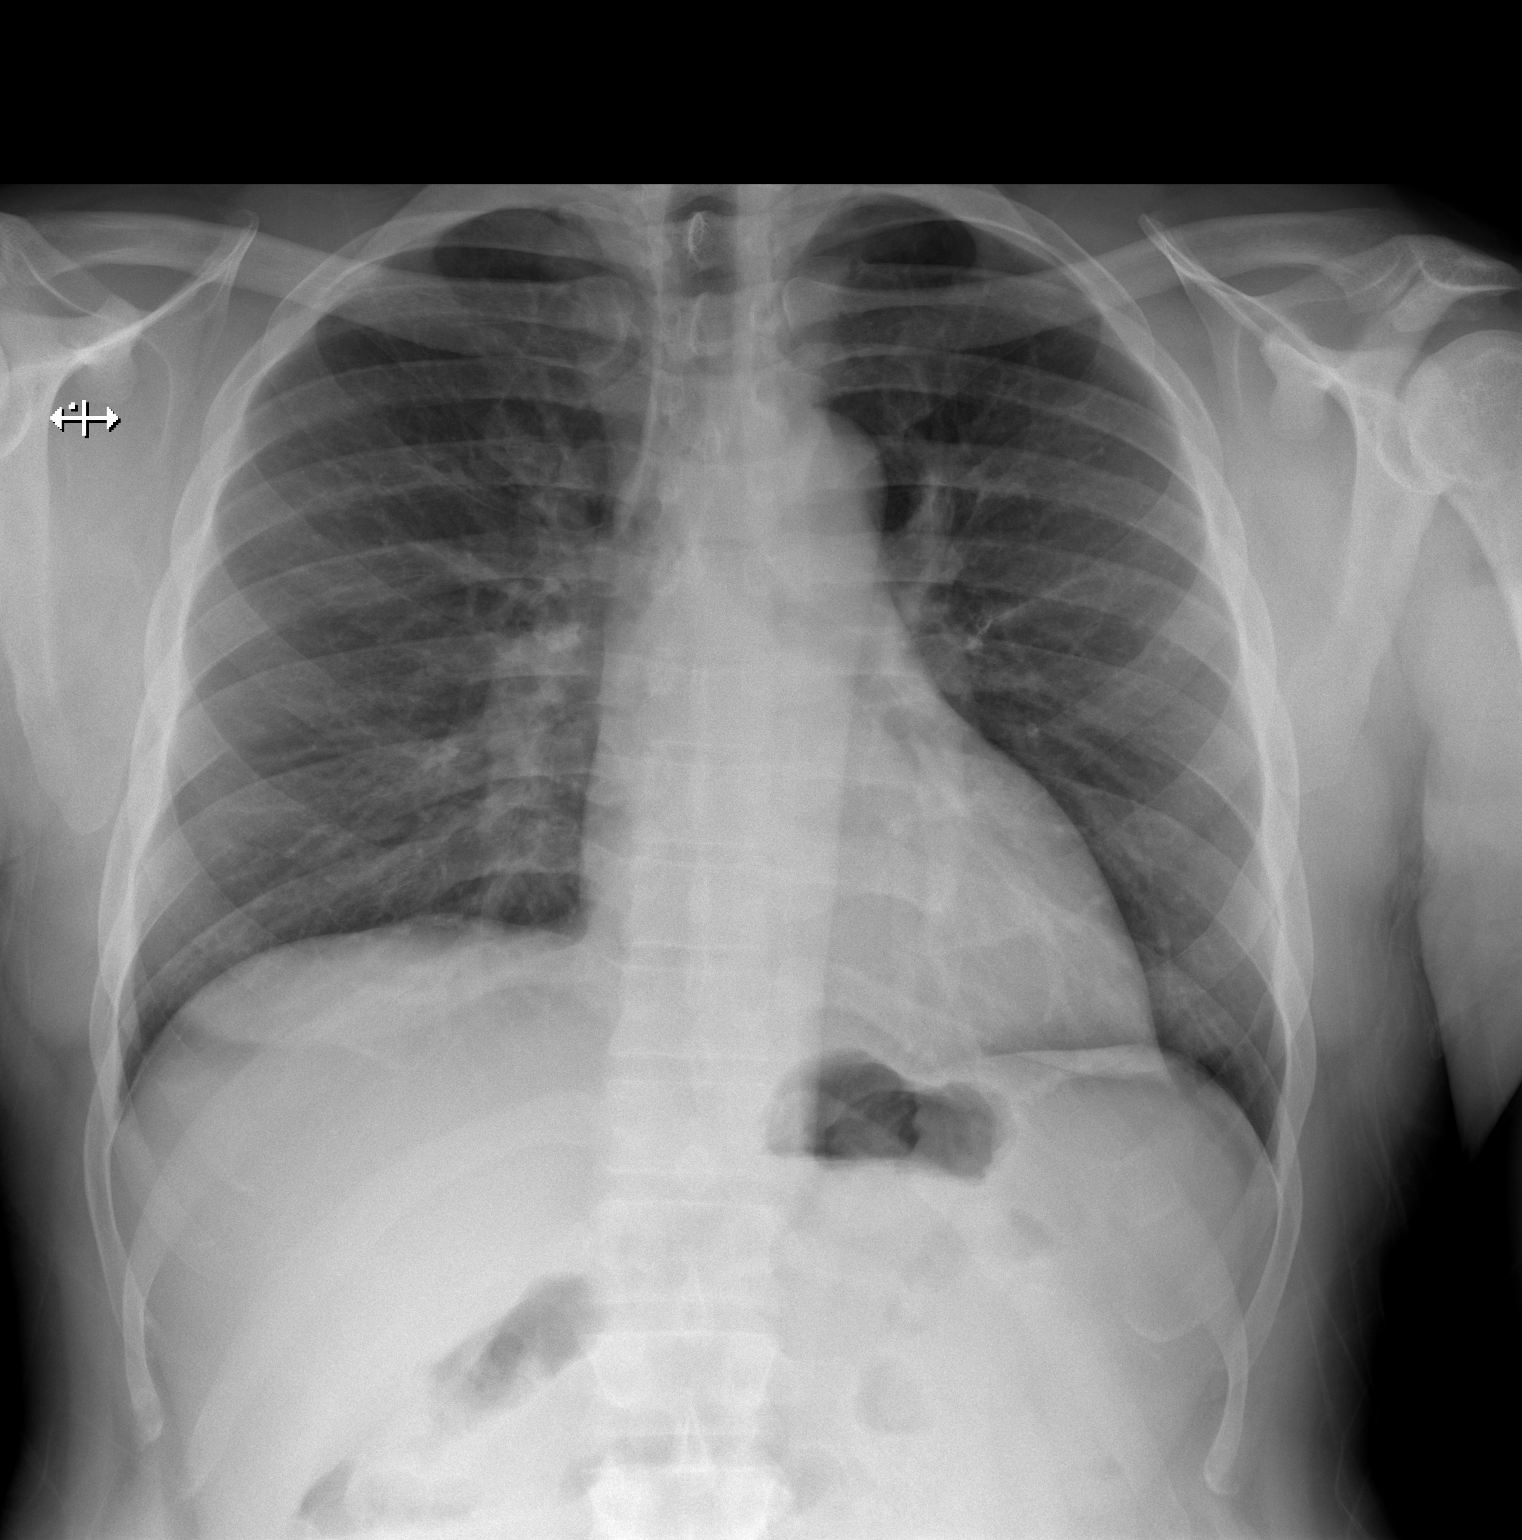

[w chest lat]
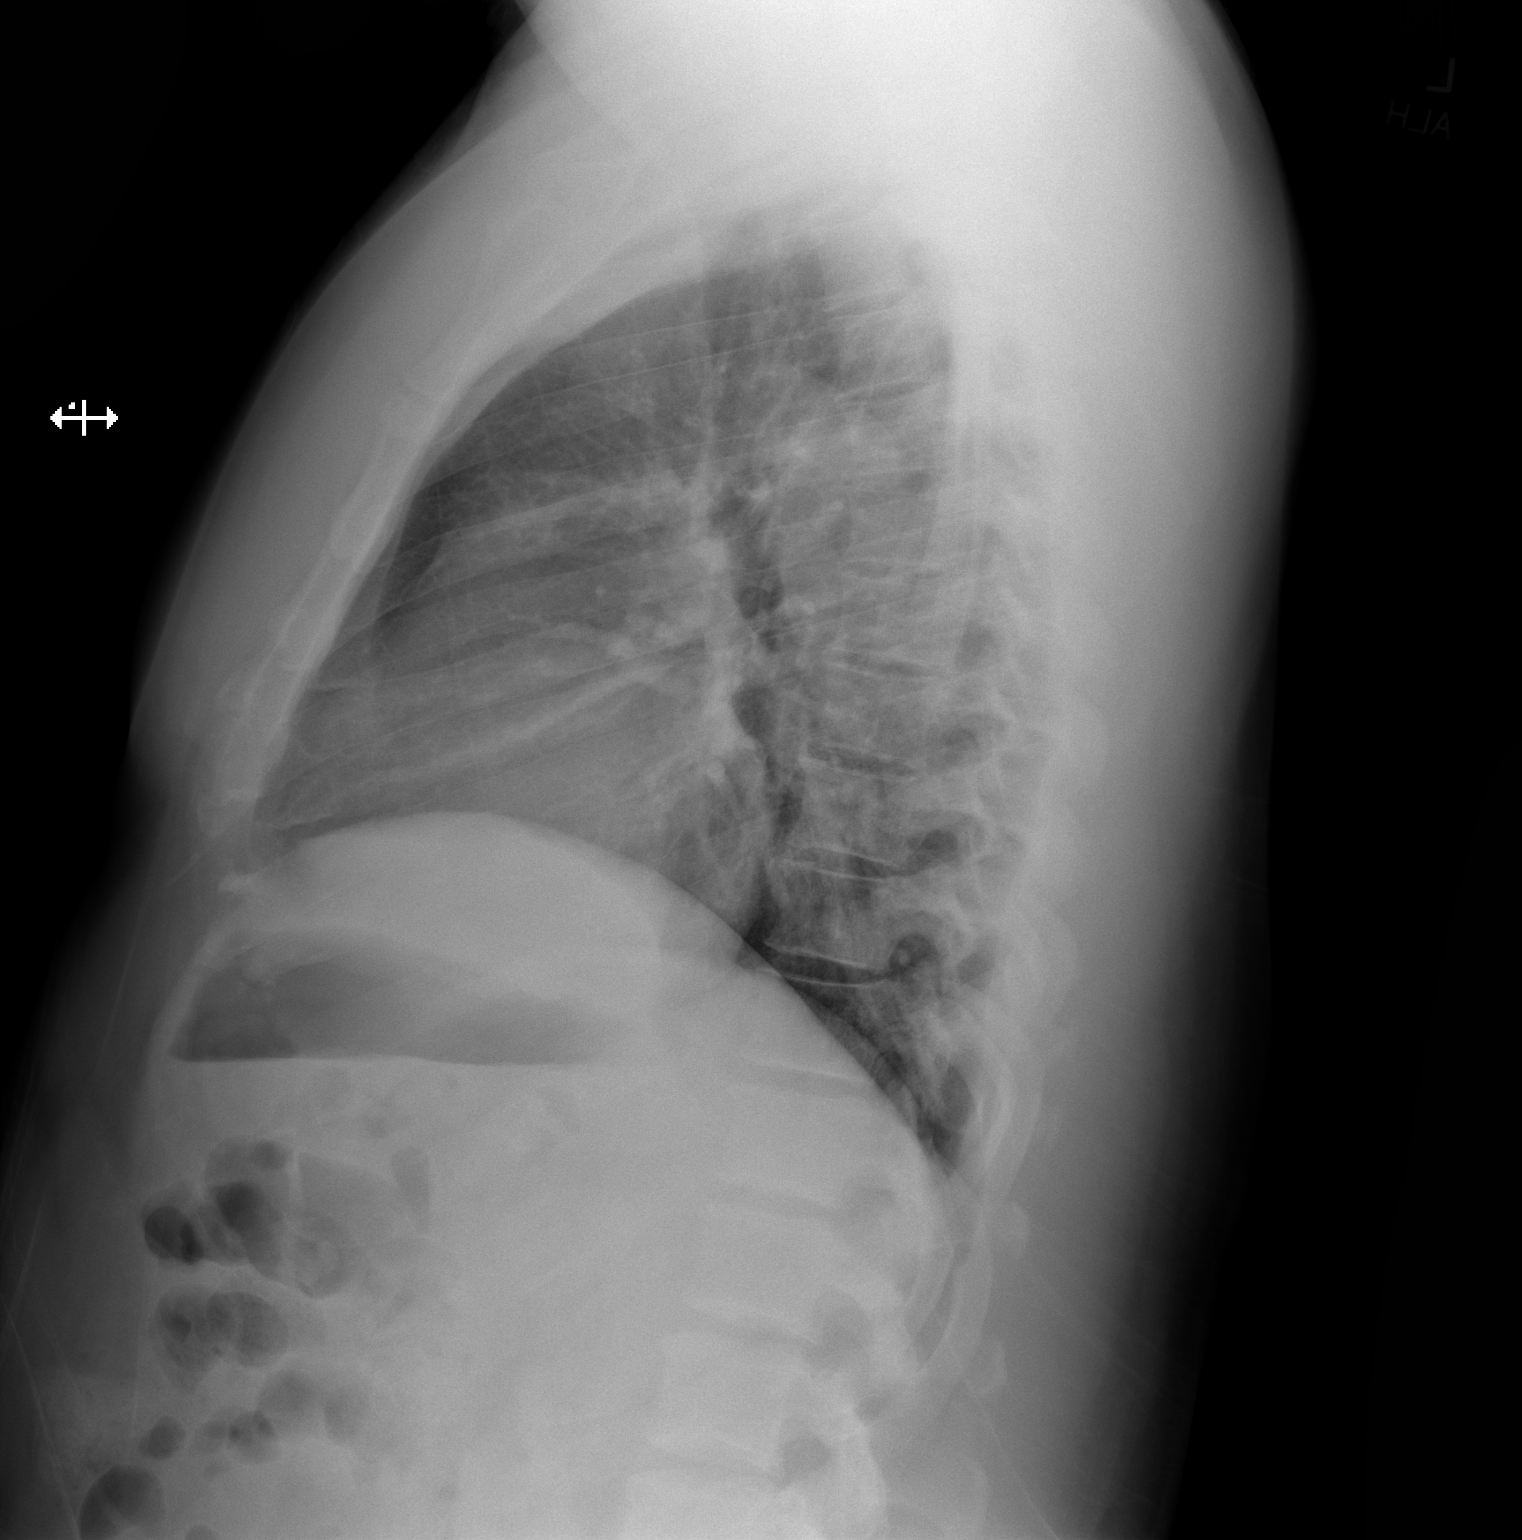

[2 of 2 positions shown; findings below may reference images not displayed]

FINDINGS: The heart size and mediastinal contours are within normal limits.
Both lungs are clear. The visualized skeletal structures are
unremarkable.
IMPRESSION: No active cardiopulmonary disease.

## 2024-10-21 ENCOUNTER — Other Ambulatory Visit: Payer: Self-pay

## 2024-10-21 ENCOUNTER — Encounter (HOSPITAL_BASED_OUTPATIENT_CLINIC_OR_DEPARTMENT_OTHER): Payer: Self-pay

## 2024-10-21 ENCOUNTER — Emergency Department (HOSPITAL_BASED_OUTPATIENT_CLINIC_OR_DEPARTMENT_OTHER)
Admission: EM | Admit: 2024-10-21 | Discharge: 2024-10-21 | Disposition: A | Discharge status: Home / Self Care | Source: Ambulatory Visit | Attending: Emergency Medicine | Admitting: Emergency Medicine

## 2024-10-21 DIAGNOSIS — I10 Essential (primary) hypertension: Secondary | ICD-10-CM | POA: Insufficient documentation

## 2024-10-21 DIAGNOSIS — Z87891 Personal history of nicotine dependence: Secondary | ICD-10-CM | POA: Diagnosis not present

## 2024-10-21 LAB — CBC
HCT: 43.6 % (ref 39.0–52.0)
Hemoglobin: 14.8 g/dL (ref 13.0–17.0)
MCH: 30.8 pg (ref 26.0–34.0)
MCHC: 33.9 g/dL (ref 30.0–36.0)
MCV: 90.6 fL (ref 80.0–100.0)
Platelets: 288 K/uL (ref 150–400)
RBC: 4.81 MIL/uL (ref 4.22–5.81)
RDW: 12.8 % (ref 11.5–15.5)
WBC: 7.9 K/uL (ref 4.0–10.5)
nRBC: 0 % (ref 0.0–0.2)

## 2024-10-21 LAB — BASIC METABOLIC PANEL WITH GFR
Anion gap: 10 (ref 5–15)
BUN: 13 mg/dL (ref 6–20)
CO2: 25 mmol/L (ref 22–32)
Calcium: 9.5 mg/dL (ref 8.9–10.3)
Chloride: 105 mmol/L (ref 98–111)
Creatinine, Ser: 1.23 mg/dL (ref 0.61–1.24)
GFR, Estimated: >60 mL/min (ref >60)
Glucose, Bld: 108 mg/dL — ABNORMAL HIGH (ref 70–99)
Potassium: 4 mmol/L (ref 3.5–5.1)
Sodium: 139 mmol/L (ref 135–145)

## 2024-10-21 MED ORDER — AMLODIPINE BESYLATE 5 MG PO TABS: 5.0000 mg | ORAL_TABLET | Freq: Every day | ORAL | 0 refills | Status: AC

## 2024-10-21 MED ORDER — HYDRALAZINE HCL 10 MG PO TABS
10.0000 mg | ORAL_TABLET | Freq: Once | ORAL | Status: AC
  Administered 2024-10-21: 10 mg via ORAL
  Filled 2024-10-21: qty 1

## 2024-10-21 NOTE — Discharge Instructions (Addendum)
 Start amlodipine, 1 tablet by mouth daily for management of your high blood pressure.  I provide you with contact information for [facility], please contact their office to establish care since you do not have a primary care provider.  Return to the emergency department if your symptoms worsen.

## 2024-10-21 NOTE — ED Triage Notes (Signed)
 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 He has no hypertension history he reports and takes no medications for it. Readings at health and wellness are as follows: 164/116, 186/129, 170/120.   Says he has slight headache recently and had a cold, otherwise no other symptoms.

## 2024-10-21 NOTE — ED Provider Notes (Signed)
  EMERGENCY DEPARTMENT AT [facility] Provider Note   CSN: [id number] Arrival date & time: 10/21/24  1407     Patient presents with: Hypertension   Wesley Aguilar is a 38 y.o. male.   38 year old male presenting with hypertension.  Patient was participating in a health and wellness event through his job when he was found to be significantly hypertensive, with the highest blood pressure reading of 186/129.  Patient denies history of hypertension, has never been on antihypertensive agents.  He endorsed a mild headache earlier but this resolved after a nap.  He denies chest pain, shortness of breath, abdominal pain/nausea/vomiting/diarrhea, lower extremity edema, oliguria.   Hypertension       Prior to Admission medications   Medication Sig Start Date End Date Taking? Authorizing Provider  Vitamin D , Ergocalciferol , (DRISDOL ) 1.25 MG (50000 UNIT) CAPS capsule Take 1 capsule (50,000 Units total) by mouth every 7 (seven) days. 03/18/21   Hollis, Lachina M, FNP    Allergies: Patient has no known allergies.    Review of Systems  Updated Vital Signs  Vitals:   10/21/24 1745 10/21/24 1800 10/21/24 1830 10/21/24 1900  BP: (!) 162/108 (!) 145/104 (!) 145/105 (!) 151/98  Pulse: 72 70 80 72  Resp: (!) 22 20 (!) 26 (!) 27  Temp:      SpO2: 98% 98% 97% 96%     Physical Exam Vitals and nursing note reviewed.  HENT:     Head: Normocephalic.  Eyes:     Extraocular Movements: Extraocular movements intact.  Cardiovascular:     Rate and Rhythm: Normal rate.  Pulmonary:     Effort: Pulmonary effort is normal.  Musculoskeletal:     Cervical back: Normal range of motion.     Right lower leg: No edema.     Left lower leg: No edema.     Comments: Moves all extremities spontaneously without difficulty  Skin:    General: Skin is warm and dry.  Neurological:     Mental Status: He is alert and oriented to person, place, and time.     (all labs ordered are  listed, but only abnormal results are displayed) Labs Reviewed  BASIC METABOLIC PANEL WITH GFR - Abnormal; Notable for the following components:      Result Value   Glucose, Bld 108 (*)    All other components within normal limits  CBC    EKG: EKG Interpretation Date/Time:  Friday October 21 2024 14:22:35 EST Ventricular Rate:  100 PR Interval:  136 QRS Duration:  90 QT Interval:  346 QTC Calculation: 446 R Axis:   31  Text Interpretation: Normal sinus rhythm ST & T wave abnormality, consider inferior ischemia Abnormal ECG When compared with ECG of 12-Mar-2021 10:18, Vent. rate has increased BY  49 BPM ST now depressed in Inferior leads Non-specific change in ST segment in Lateral leads T wave inversion now evident in Inferior leads QT has lengthened Confirmed by Ellouise Fine ([id number]) on 10/21/2024 5:33:20 PM  Radiology: No results found.   Procedures   Medications Ordered in the ED  hydrALAZINE (APRESOLINE) tablet 10 mg (has no administration in time range)                                    Medical Decision Making This patient presents to the ED for concern of hypertension, this involves an extensive number of treatment options, and is  a complaint that carries with it a high risk of complications and morbidity.  The differential diagnosis includes primary hypertension, secondary hypertension, hypertensive emergency, ACS  Additional history obtained:  Additional history obtained from record review External records from outside source obtained and reviewed including prior ED note   Lab Tests:  I Ordered, and personally interpreted labs.  The pertinent results include: CBC and BMP unremarkable   Cardiac Monitoring: / EKG:  The patient was maintained on a cardiac monitor.  I personally viewed and interpreted the cardiac monitored which showed an underlying rhythm of: NSR   Problem List / ED Course / Critical interventions / Medication management  I ordered  medication including PO hydralazine  for hypertension  Reevaluation of the patient after these medicines showed that the patient improved I have reviewed the patients home medicines and have made adjustments as needed   Social Determinants of Health:  Former tobacco use   Test / Admission - Considered:  Physical exam is largely unremarkable as above, patient does not have any signs/symptoms suggestive of hypertensive emergency, including chest pain, shortness of breath, lower extremity edema, oliguria.  Labs are reassuring, will provide patient with 1 dose of hydralazine and reassess for symptomatic improvement. At time my reassessment, patient's blood pressure has lowered from 153/109 at time of initial presentation to 139/94 in the room at time of my final assessment after receiving meds.  Patient's blood pressure was notably elevated today before and after presenting to the emergency department, he likely has been dealing with untreated hypertension for some time, he admits that he does not see a PCP regularly nor does he regularly check his blood pressure.  Will have patient initiate 5 mg of amlodipine, I have also provided him with contact information for [facility], I encouraged him to contact their office to establish care so that his blood pressure medications can be managed and so that someone will be following up on his hypertension.  He voiced understanding and is in agreement with this, return precautions discussed, he is appropriate for discharge at this time.    Amount and/or Complexity of Data Reviewed Labs: ordered.  Risk Prescription drug management.        Final diagnoses:  Hypertension, unspecified type    ED Discharge Orders          Ordered    amLODipine (NORVASC) 5 MG tablet  Daily        10/21/24 1940               Glendia Rocky SAILOR, PA-C 10/22/24 0034    Kingsley, Victoria K, DO 10/22/24 1504
